# Patient Record
Sex: Male | Born: 1950
Health system: Southern US, Community
[De-identification: ages and names within clinical notes are randomized; demographics above are authoritative.]

## PROBLEM LIST (undated history)

## (undated) DIAGNOSIS — M545 Low back pain, unspecified: Secondary | ICD-10-CM

## (undated) DIAGNOSIS — K219 Gastro-esophageal reflux disease without esophagitis: Secondary | ICD-10-CM

## (undated) DIAGNOSIS — I10 Essential (primary) hypertension: Secondary | ICD-10-CM

## (undated) DIAGNOSIS — G8929 Other chronic pain: Secondary | ICD-10-CM

## (undated) DIAGNOSIS — F419 Anxiety disorder, unspecified: Secondary | ICD-10-CM

## (undated) DIAGNOSIS — G4730 Sleep apnea, unspecified: Secondary | ICD-10-CM

## (undated) DIAGNOSIS — E559 Vitamin D deficiency, unspecified: Secondary | ICD-10-CM

## (undated) DIAGNOSIS — C801 Malignant (primary) neoplasm, unspecified: Secondary | ICD-10-CM

## (undated) DIAGNOSIS — M48061 Spinal stenosis, lumbar region without neurogenic claudication: Secondary | ICD-10-CM

## (undated) DIAGNOSIS — M1990 Unspecified osteoarthritis, unspecified site: Secondary | ICD-10-CM

## (undated) DIAGNOSIS — M48062 Spinal stenosis, lumbar region with neurogenic claudication: Secondary | ICD-10-CM

## (undated) DIAGNOSIS — R7303 Prediabetes: Secondary | ICD-10-CM

## (undated) DIAGNOSIS — R221 Localized swelling, mass and lump, neck: Secondary | ICD-10-CM

## (undated) DIAGNOSIS — F32A Depression, unspecified: Secondary | ICD-10-CM

## (undated) DIAGNOSIS — E785 Hyperlipidemia, unspecified: Secondary | ICD-10-CM

## (undated) DIAGNOSIS — I5189 Other ill-defined heart diseases: Secondary | ICD-10-CM

## (undated) HISTORY — DX: Essential (primary) hypertension: I10

## (undated) HISTORY — PX: COLON SURGERY: SHX602

## (undated) HISTORY — DX: Unspecified osteoarthritis, unspecified site: M19.90

---

## 2004-03-25 HISTORY — PX: COLONOSCOPY: SHX174

## 2005-01-23 DIAGNOSIS — C801 Malignant (primary) neoplasm, unspecified: Secondary | ICD-10-CM

## 2005-01-23 DIAGNOSIS — C189 Malignant neoplasm of colon, unspecified: Secondary | ICD-10-CM

## 2005-01-23 HISTORY — PX: COLECTOMY: SHX59

## 2005-01-23 HISTORY — DX: Malignant neoplasm of colon, unspecified: C18.9

## 2005-01-23 HISTORY — DX: Malignant (primary) neoplasm, unspecified: C80.1

## 2005-03-25 DIAGNOSIS — Z9221 Personal history of antineoplastic chemotherapy: Secondary | ICD-10-CM

## 2005-03-25 DIAGNOSIS — C801 Malignant (primary) neoplasm, unspecified: Secondary | ICD-10-CM

## 2005-03-25 HISTORY — DX: Personal history of antineoplastic chemotherapy: Z92.21

## 2005-03-25 HISTORY — DX: Malignant (primary) neoplasm, unspecified: C80.1

## 2005-03-25 HISTORY — PX: PORTACATH PLACEMENT: SHX2246

## 2005-08-23 HISTORY — PX: PORT-A-CATH REMOVAL: SHX5289

## 2016-07-16 DIAGNOSIS — R7303 Prediabetes: Secondary | ICD-10-CM | POA: Diagnosis not present

## 2016-07-16 DIAGNOSIS — Z0000 Encounter for general adult medical examination without abnormal findings: Secondary | ICD-10-CM | POA: Diagnosis not present

## 2016-07-16 DIAGNOSIS — E669 Obesity, unspecified: Secondary | ICD-10-CM | POA: Diagnosis not present

## 2016-07-16 DIAGNOSIS — G479 Sleep disorder, unspecified: Secondary | ICD-10-CM | POA: Diagnosis not present

## 2016-07-16 DIAGNOSIS — E559 Vitamin D deficiency, unspecified: Secondary | ICD-10-CM | POA: Diagnosis not present

## 2016-07-16 DIAGNOSIS — Z85038 Personal history of other malignant neoplasm of large intestine: Secondary | ICD-10-CM | POA: Diagnosis not present

## 2016-07-16 DIAGNOSIS — Z125 Encounter for screening for malignant neoplasm of prostate: Secondary | ICD-10-CM | POA: Diagnosis not present

## 2016-07-16 DIAGNOSIS — N528 Other male erectile dysfunction: Secondary | ICD-10-CM | POA: Diagnosis not present

## 2016-07-17 DIAGNOSIS — E1169 Type 2 diabetes mellitus with other specified complication: Secondary | ICD-10-CM | POA: Insufficient documentation

## 2016-08-22 DIAGNOSIS — H18413 Arcus senilis, bilateral: Secondary | ICD-10-CM | POA: Diagnosis not present

## 2016-08-22 DIAGNOSIS — H524 Presbyopia: Secondary | ICD-10-CM | POA: Diagnosis not present

## 2016-08-22 DIAGNOSIS — H57059 Tonic pupil, unspecified eye: Secondary | ICD-10-CM | POA: Diagnosis not present

## 2016-08-22 DIAGNOSIS — H2510 Age-related nuclear cataract, unspecified eye: Secondary | ICD-10-CM | POA: Diagnosis not present

## 2016-09-05 DIAGNOSIS — Z85038 Personal history of other malignant neoplasm of large intestine: Secondary | ICD-10-CM | POA: Diagnosis not present

## 2016-09-05 DIAGNOSIS — R221 Localized swelling, mass and lump, neck: Secondary | ICD-10-CM | POA: Diagnosis not present

## 2016-09-05 DIAGNOSIS — K5909 Other constipation: Secondary | ICD-10-CM | POA: Diagnosis not present

## 2016-09-06 DIAGNOSIS — R221 Localized swelling, mass and lump, neck: Secondary | ICD-10-CM | POA: Diagnosis not present

## 2016-09-11 DIAGNOSIS — E669 Obesity, unspecified: Secondary | ICD-10-CM | POA: Diagnosis not present

## 2016-09-11 DIAGNOSIS — Z6831 Body mass index (BMI) 31.0-31.9, adult: Secondary | ICD-10-CM | POA: Diagnosis not present

## 2016-09-11 DIAGNOSIS — E559 Vitamin D deficiency, unspecified: Secondary | ICD-10-CM | POA: Diagnosis not present

## 2016-09-18 DIAGNOSIS — R221 Localized swelling, mass and lump, neck: Secondary | ICD-10-CM | POA: Diagnosis not present

## 2016-09-23 DIAGNOSIS — G479 Sleep disorder, unspecified: Secondary | ICD-10-CM | POA: Diagnosis not present

## 2016-09-23 DIAGNOSIS — G4752 REM sleep behavior disorder: Secondary | ICD-10-CM | POA: Diagnosis not present

## 2016-09-23 DIAGNOSIS — R4689 Other symptoms and signs involving appearance and behavior: Secondary | ICD-10-CM | POA: Diagnosis not present

## 2016-09-23 DIAGNOSIS — G4733 Obstructive sleep apnea (adult) (pediatric): Secondary | ICD-10-CM | POA: Diagnosis not present

## 2016-10-01 DIAGNOSIS — K635 Polyp of colon: Secondary | ICD-10-CM | POA: Diagnosis not present

## 2016-10-01 DIAGNOSIS — Z980 Intestinal bypass and anastomosis status: Secondary | ICD-10-CM | POA: Diagnosis not present

## 2016-10-01 DIAGNOSIS — D123 Benign neoplasm of transverse colon: Secondary | ICD-10-CM | POA: Diagnosis not present

## 2016-10-01 DIAGNOSIS — Z85038 Personal history of other malignant neoplasm of large intestine: Secondary | ICD-10-CM | POA: Diagnosis not present

## 2016-10-01 DIAGNOSIS — D126 Benign neoplasm of colon, unspecified: Secondary | ICD-10-CM | POA: Diagnosis not present

## 2016-10-01 DIAGNOSIS — K640 First degree hemorrhoids: Secondary | ICD-10-CM | POA: Diagnosis not present

## 2016-10-01 DIAGNOSIS — K648 Other hemorrhoids: Secondary | ICD-10-CM | POA: Diagnosis not present

## 2016-10-01 HISTORY — PX: COLONOSCOPY: SHX174

## 2016-10-07 DIAGNOSIS — G4733 Obstructive sleep apnea (adult) (pediatric): Secondary | ICD-10-CM | POA: Diagnosis not present

## 2016-11-13 DIAGNOSIS — G4733 Obstructive sleep apnea (adult) (pediatric): Secondary | ICD-10-CM | POA: Diagnosis not present

## 2017-02-07 DIAGNOSIS — Z0000 Encounter for general adult medical examination without abnormal findings: Secondary | ICD-10-CM | POA: Diagnosis not present

## 2017-02-27 DIAGNOSIS — N528 Other male erectile dysfunction: Secondary | ICD-10-CM | POA: Diagnosis not present

## 2017-02-27 DIAGNOSIS — R7303 Prediabetes: Secondary | ICD-10-CM | POA: Diagnosis not present

## 2017-07-10 DIAGNOSIS — Z0000 Encounter for general adult medical examination without abnormal findings: Secondary | ICD-10-CM | POA: Diagnosis not present

## 2017-07-10 DIAGNOSIS — R7303 Prediabetes: Secondary | ICD-10-CM | POA: Diagnosis not present

## 2017-07-10 DIAGNOSIS — E669 Obesity, unspecified: Secondary | ICD-10-CM | POA: Diagnosis not present

## 2017-07-10 DIAGNOSIS — Z136 Encounter for screening for cardiovascular disorders: Secondary | ICD-10-CM | POA: Diagnosis not present

## 2017-07-23 ENCOUNTER — Encounter (HOSPITAL_COMMUNITY): Payer: Self-pay

## 2017-07-23 ENCOUNTER — Emergency Department (HOSPITAL_COMMUNITY): Payer: Medicare HMO

## 2017-07-23 ENCOUNTER — Emergency Department (HOSPITAL_COMMUNITY)
Admission: EM | Admit: 2017-07-23 | Discharge: 2017-07-23 | Disposition: A | Discharge status: Home / Self Care | Payer: Medicare HMO | Attending: Emergency Medicine | Admitting: Emergency Medicine

## 2017-07-23 ENCOUNTER — Other Ambulatory Visit: Payer: Self-pay

## 2017-07-23 DIAGNOSIS — K5900 Constipation, unspecified: Secondary | ICD-10-CM | POA: Diagnosis not present

## 2017-07-23 DIAGNOSIS — M545 Low back pain: Secondary | ICD-10-CM | POA: Diagnosis not present

## 2017-07-23 DIAGNOSIS — Z85038 Personal history of other malignant neoplasm of large intestine: Secondary | ICD-10-CM | POA: Insufficient documentation

## 2017-07-23 DIAGNOSIS — M5442 Lumbago with sciatica, left side: Secondary | ICD-10-CM | POA: Diagnosis not present

## 2017-07-23 DIAGNOSIS — M5441 Lumbago with sciatica, right side: Secondary | ICD-10-CM | POA: Diagnosis not present

## 2017-07-23 HISTORY — DX: Malignant (primary) neoplasm, unspecified: C80.1

## 2017-07-23 MED ORDER — PREDNISONE 10 MG (21) PO TBPK: ORAL_TABLET | Freq: Every day | ORAL | 0 refills | Status: DC

## 2017-07-23 MED ORDER — ACETAMINOPHEN 325 MG PO TABS: 650.0000 mg | ORAL_TABLET | Freq: Four times a day (QID) | ORAL | 0 refills | Status: DC | PRN

## 2017-07-23 MED ORDER — IBUPROFEN 200 MG PO TABS: 200.0000 mg | ORAL_TABLET | Freq: Four times a day (QID) | ORAL | 0 refills | Status: DC | PRN

## 2017-07-23 NOTE — ED Triage Notes (Signed)
Pt arrived from home with c/o lower back pain down into legs X5-6 months. Pt denies being evaluated for this states he has been using ibuprofen and tylenol for pain. Denies loss of bowel or bladder.

## 2017-07-23 NOTE — Discharge Instructions (Addendum)
You may alternate taking Tylenol and Ibuprofen as needed for pain control. You may take 400-600 mg of ibuprofen every 6 hours and [phone] mg of Tylenol every 6 hours. Do not exceed 4000 mg of Tylenol daily as this can lead to liver damage. Also, make sure to take Ibuprofen with meals as it can cause an upset stomach. Do not take other NSAIDs while taking Ibuprofen such as (Aleve, Naprosyn, Aspirin, Celebrex, etc) and do not take more than the prescribed dose as this can lead to ulcers and bleeding in your GI tract. You may use warm and cold compresses to help with your symptoms.   You were also given a prescription for a steroid taper to help improve your symptoms. I have prescribed a new medication for you today. It is important that when you pick the prescription up you discuss the potential interactions of this medication with other medications you are taking, including over the counter medications, with the pharmacists.   This new medication has potential side effects. Be sure to contact your primary care provider or return to the emergency department if you are experiencing new symptoms that you are unable to tolerate after starting the medication. You need to receive medical evaluation immediately if you start to experience blistering of the skin, rash, swelling, or difficulty breathing as these signs could indicate a more serious medication side effect.   Please follow up with your primary doctor within the next 7-10 days for re-evaluation and further treatment of your symptoms.  You were also given a referral to a spine doctor, Dr.Yarbrough, and you may call the office to make an appointment for follow-up  Return to the emergency department immediately if you experience any back pain associated with fevers, loss of control of your bowels/bladder, weakness/numbness to your legs, numbness to your groin area, inability to walk, or inability to urinate.

## 2017-07-23 NOTE — ED Provider Notes (Signed)
[place] EMERGENCY DEPARTMENT Provider Note   CSN: [id number] Arrival date & time: 07/23/17  0957     History   Chief Complaint Chief Complaint  Patient presents with  . Back Pain    HPI Travis Santiago is a 67 y.o. male.  HPI   Pt is a 67 year old male with a history of colon cancer s/p resection presents the ED complaining of lower back pain that has been ongoing for the last 6 months.  States pain is constant and severe in nature.  It is worse when he walks for long periods of time.  He has tried taking ibuprofen with no relief.  States pain radiates to bilateral lower extremities.  Reports intermittent paresthesias to the bilateral lower extremities but denies any numbness or weakness to the legs. Denies saddle anesthesia. Denies loss of control of bowels or bladder. No urinary retention. No fevers or night sweats. Denies a h/o IVDU. Denies recent unintended weight loss.  Denies abdominal pain, nausea, vomiting, diarrhea.  No urinary symptoms.  Has history of chronic constipation.  No blood in stool.  No chest pain or shortness of breath.    Past Medical History:  Diagnosis Date  . Cancer [facility]) 2007   colon     There are no active problems to display for this patient.   Past Surgical History:  Procedure Laterality Date  . COLON SURGERY          Home Medications    Prior to Admission medications   Not on File    Family History No family history on file.  Social History Social History   Tobacco Use  . Smoking status: Never Smoker  . Smokeless tobacco: Never Used  Substance Use Topics  . Alcohol use: Yes    Alcohol/week: 0.6 oz    Types: 1 Glasses of wine per week    Comment: social   . Drug use: Not Currently     Allergies   Patient has no known allergies.   Review of Systems Review of Systems  Constitutional: Negative for chills and fever.  HENT: Negative for ear pain and sore throat.   Eyes: Negative for pain and  visual disturbance.  Respiratory: Negative for shortness of breath.   Cardiovascular: Negative for chest pain and palpitations.  Gastrointestinal: Positive for constipation. Negative for abdominal pain, diarrhea, nausea and vomiting.  Genitourinary: Negative for dysuria, flank pain, frequency, hematuria and urgency.  Musculoskeletal: Positive for back pain.  Skin: Negative for rash.  Neurological: Negative for weakness and numbness.       Paresthesias  All other systems reviewed and are negative.    Physical Exam Updated Vital Signs BP (!) 124/96 (BP Location: Right Arm)   Pulse 85   Temp 98.3 F (36.8 C) (Oral)   Resp 16   Ht 5\' 7"  (1.702 m)   Wt 90.7 kg (200 lb)   SpO2 95%   BMI 31.32 kg/m   Physical Exam  Constitutional: He appears well-developed and well-nourished.  HENT:  Head: Normocephalic and atraumatic.  Eyes: Conjunctivae are normal.  Neck: Neck supple.  Cardiovascular: Normal rate, regular rhythm, normal heart sounds and intact distal pulses.  No murmur heard. Pulmonary/Chest: Effort normal and breath sounds normal. No respiratory distress. He has no wheezes.  Abdominal: Soft. Bowel sounds are normal. There is no tenderness.  Musculoskeletal: He exhibits no edema.  No cervical or thoracic midline ttp. Midline lumbar ttp with associated bilateral paraspinous tenderness and tenderness across bilateral  sciatic notches which reproduces his pain.  Neurological: He is alert.  Normal tone. 5/5 strength of BUE and BLE major muscle groups including strong and equal grip strength and dorsiflexion/plantar flexion Sensory: light touch normal in all extremities. DTRs: patellar 2+ symmetric b/l Gait: normal gait and balance. Able to walk on toes CV: 2+ radial and DP/PT pulses  Skin: Skin is warm and dry.  Psychiatric: He has a normal mood and affect.  Nursing note and vitals reviewed.   ED Treatments / Results  Labs (all labs ordered are listed, but only abnormal  results are displayed) Labs Reviewed - No data to display  EKG None  Radiology Dg Lumbar Spine Complete  Result Date: 07/23/2017 CLINICAL DATA:  Chronic low back pain for 6 months. EXAM: LUMBAR SPINE - COMPLETE 4+ VIEW COMPARISON:  None. FINDINGS: Normal alignment. Disc spaces maintained. No fracture. Mild degenerative facet disease from L3-4 through L5-S1. SI joints are symmetric and unremarkable. IMPRESSION: Degenerative facet disease in the mid and lower lumbar spine. No acute findings. Electronically Signed   By: Rolm Baptise M.D.   On: 07/23/2017 11:23    Procedures Procedures (including critical care time)  Medications Ordered in ED Medications - No data to display   Initial Impression / Assessment and Plan / ED Course  I have reviewed the triage vital signs and the nursing notes.  Pertinent labs & imaging results that were available during my care of the patient were reviewed by me and considered in my medical decision making (see chart for details).      Final Clinical Impressions(s) / ED Diagnoses   Final diagnoses:  Acute midline low back pain with bilateral sciatica   Patient with back pain.  No neurological deficits and normal neuro exam.  Patient can walk but states is painful.  No loss of bowel or bladder control.  No concern for cauda equina.  No fever, night sweats, weight loss, or h/o IVDU.  Patient does have distant history of colon cancer in remission therefore lumbar x-ray was ordered.  X-ray showed degenerative changes but no acute findings.  RICE protocol and pain medicine indicated and discussed with patient.  Will also give him steroid taper and have him follow-up with PCP in 1 week for reevaluation.  Gave strict return precautions for any new or worsening symptoms.  Patient and his wife voiced understanding of plan restarting immediately to the ED.  All questions were answered.  ED Discharge Orders    None       Rodney Booze, [place] 07/23/17 1226     Little, Wenda Overland, MD 07/23/17 1550

## 2017-07-25 ENCOUNTER — Other Ambulatory Visit: Payer: Self-pay | Admitting: Family Medicine

## 2017-07-25 DIAGNOSIS — M4306 Spondylolysis, lumbar region: Secondary | ICD-10-CM | POA: Diagnosis not present

## 2017-07-25 DIAGNOSIS — M5416 Radiculopathy, lumbar region: Secondary | ICD-10-CM

## 2017-08-01 ENCOUNTER — Ambulatory Visit
Admission: RE | Admit: 2017-08-01 | Discharge: 2017-08-01 | Disposition: A | Discharge status: Home / Self Care | Payer: Medicare HMO | Source: Ambulatory Visit | Attending: Family Medicine | Admitting: Family Medicine

## 2017-08-01 DIAGNOSIS — M5416 Radiculopathy, lumbar region: Secondary | ICD-10-CM

## 2017-08-01 DIAGNOSIS — M8938 Hypertrophy of bone, other site: Secondary | ICD-10-CM | POA: Insufficient documentation

## 2017-08-01 DIAGNOSIS — M47896 Other spondylosis, lumbar region: Secondary | ICD-10-CM | POA: Insufficient documentation

## 2017-08-01 DIAGNOSIS — M545 Low back pain: Secondary | ICD-10-CM | POA: Diagnosis not present

## 2017-08-01 DIAGNOSIS — M48061 Spinal stenosis, lumbar region without neurogenic claudication: Secondary | ICD-10-CM | POA: Diagnosis not present

## 2017-08-01 DIAGNOSIS — M5116 Intervertebral disc disorders with radiculopathy, lumbar region: Secondary | ICD-10-CM | POA: Insufficient documentation

## 2017-08-15 DIAGNOSIS — M5416 Radiculopathy, lumbar region: Secondary | ICD-10-CM | POA: Diagnosis not present

## 2017-08-15 DIAGNOSIS — M5136 Other intervertebral disc degeneration, lumbar region: Secondary | ICD-10-CM | POA: Diagnosis not present

## 2017-08-15 DIAGNOSIS — M48062 Spinal stenosis, lumbar region with neurogenic claudication: Secondary | ICD-10-CM | POA: Diagnosis not present

## 2017-08-25 DIAGNOSIS — H524 Presbyopia: Secondary | ICD-10-CM | POA: Diagnosis not present

## 2017-08-26 DIAGNOSIS — Z0100 Encounter for examination of eyes and vision without abnormal findings: Secondary | ICD-10-CM | POA: Diagnosis not present

## 2017-09-05 DIAGNOSIS — M48062 Spinal stenosis, lumbar region with neurogenic claudication: Secondary | ICD-10-CM | POA: Diagnosis not present

## 2017-09-05 DIAGNOSIS — M5416 Radiculopathy, lumbar region: Secondary | ICD-10-CM | POA: Diagnosis not present

## 2017-09-05 DIAGNOSIS — M5136 Other intervertebral disc degeneration, lumbar region: Secondary | ICD-10-CM | POA: Diagnosis not present

## 2017-09-26 DIAGNOSIS — M48062 Spinal stenosis, lumbar region with neurogenic claudication: Secondary | ICD-10-CM | POA: Diagnosis not present

## 2017-09-26 DIAGNOSIS — M4306 Spondylolysis, lumbar region: Secondary | ICD-10-CM | POA: Diagnosis not present

## 2017-09-26 DIAGNOSIS — M5416 Radiculopathy, lumbar region: Secondary | ICD-10-CM | POA: Diagnosis not present

## 2017-10-07 DIAGNOSIS — M47816 Spondylosis without myelopathy or radiculopathy, lumbar region: Secondary | ICD-10-CM | POA: Diagnosis not present

## 2017-10-07 DIAGNOSIS — M48061 Spinal stenosis, lumbar region without neurogenic claudication: Secondary | ICD-10-CM | POA: Diagnosis not present

## 2017-10-20 DIAGNOSIS — R29898 Other symptoms and signs involving the musculoskeletal system: Secondary | ICD-10-CM | POA: Diagnosis not present

## 2017-10-20 DIAGNOSIS — G8929 Other chronic pain: Secondary | ICD-10-CM | POA: Diagnosis not present

## 2017-10-20 DIAGNOSIS — R200 Anesthesia of skin: Secondary | ICD-10-CM | POA: Diagnosis not present

## 2017-10-20 DIAGNOSIS — M545 Low back pain: Secondary | ICD-10-CM | POA: Diagnosis not present

## 2017-11-11 DIAGNOSIS — M5416 Radiculopathy, lumbar region: Secondary | ICD-10-CM | POA: Diagnosis not present

## 2017-11-12 ENCOUNTER — Other Ambulatory Visit: Payer: Self-pay | Admitting: Student

## 2017-11-12 DIAGNOSIS — G5791 Unspecified mononeuropathy of right lower limb: Secondary | ICD-10-CM

## 2017-11-18 DIAGNOSIS — M5416 Radiculopathy, lumbar region: Secondary | ICD-10-CM | POA: Diagnosis not present

## 2017-11-20 DIAGNOSIS — M5416 Radiculopathy, lumbar region: Secondary | ICD-10-CM | POA: Diagnosis not present

## 2017-11-23 ENCOUNTER — Ambulatory Visit
Admission: RE | Admit: 2017-11-23 | Discharge: 2017-11-23 | Disposition: A | Discharge status: Home / Self Care | Payer: Medicare HMO | Source: Ambulatory Visit | Attending: Student | Admitting: Student

## 2017-11-23 DIAGNOSIS — G5791 Unspecified mononeuropathy of right lower limb: Secondary | ICD-10-CM | POA: Insufficient documentation

## 2017-11-23 DIAGNOSIS — M79661 Pain in right lower leg: Secondary | ICD-10-CM | POA: Diagnosis not present

## 2017-11-26 ENCOUNTER — Other Ambulatory Visit: Payer: Self-pay | Admitting: Neurological Surgery

## 2017-11-26 DIAGNOSIS — M5416 Radiculopathy, lumbar region: Secondary | ICD-10-CM | POA: Diagnosis not present

## 2017-11-26 DIAGNOSIS — G5791 Unspecified mononeuropathy of right lower limb: Secondary | ICD-10-CM

## 2017-12-18 ENCOUNTER — Ambulatory Visit
Admission: RE | Admit: 2017-12-18 | Discharge: 2017-12-18 | Disposition: A | Discharge status: Home / Self Care | Payer: Medicare HMO | Source: Ambulatory Visit | Attending: Neurological Surgery | Admitting: Neurological Surgery

## 2017-12-18 DIAGNOSIS — M23321 Other meniscus derangements, posterior horn of medial meniscus, right knee: Secondary | ICD-10-CM | POA: Diagnosis not present

## 2017-12-18 DIAGNOSIS — S83241A Other tear of medial meniscus, current injury, right knee, initial encounter: Secondary | ICD-10-CM | POA: Insufficient documentation

## 2017-12-18 DIAGNOSIS — G5791 Unspecified mononeuropathy of right lower limb: Secondary | ICD-10-CM | POA: Insufficient documentation

## 2017-12-18 DIAGNOSIS — X58XXXA Exposure to other specified factors, initial encounter: Secondary | ICD-10-CM | POA: Diagnosis not present

## 2017-12-23 DIAGNOSIS — M48062 Spinal stenosis, lumbar region with neurogenic claudication: Secondary | ICD-10-CM | POA: Diagnosis not present

## 2017-12-23 DIAGNOSIS — M47816 Spondylosis without myelopathy or radiculopathy, lumbar region: Secondary | ICD-10-CM | POA: Diagnosis not present

## 2017-12-23 DIAGNOSIS — G5791 Unspecified mononeuropathy of right lower limb: Secondary | ICD-10-CM | POA: Diagnosis not present

## 2018-02-10 DIAGNOSIS — M48062 Spinal stenosis, lumbar region with neurogenic claudication: Secondary | ICD-10-CM | POA: Diagnosis not present

## 2018-02-16 DIAGNOSIS — R7303 Prediabetes: Secondary | ICD-10-CM | POA: Diagnosis not present

## 2018-02-16 DIAGNOSIS — I10 Essential (primary) hypertension: Secondary | ICD-10-CM | POA: Diagnosis not present

## 2018-02-17 DIAGNOSIS — M5136 Other intervertebral disc degeneration, lumbar region: Secondary | ICD-10-CM | POA: Diagnosis not present

## 2018-02-17 DIAGNOSIS — M48062 Spinal stenosis, lumbar region with neurogenic claudication: Secondary | ICD-10-CM | POA: Diagnosis not present

## 2018-02-17 DIAGNOSIS — M4726 Other spondylosis with radiculopathy, lumbar region: Secondary | ICD-10-CM | POA: Diagnosis not present

## 2018-02-17 DIAGNOSIS — M5416 Radiculopathy, lumbar region: Secondary | ICD-10-CM | POA: Diagnosis not present

## 2018-03-31 DIAGNOSIS — M47816 Spondylosis without myelopathy or radiculopathy, lumbar region: Secondary | ICD-10-CM | POA: Diagnosis not present

## 2018-04-21 DIAGNOSIS — M47816 Spondylosis without myelopathy or radiculopathy, lumbar region: Secondary | ICD-10-CM | POA: Diagnosis not present

## 2018-04-21 DIAGNOSIS — M48062 Spinal stenosis, lumbar region with neurogenic claudication: Secondary | ICD-10-CM | POA: Diagnosis not present

## 2018-04-21 DIAGNOSIS — M5136 Other intervertebral disc degeneration, lumbar region: Secondary | ICD-10-CM | POA: Diagnosis not present

## 2018-04-21 DIAGNOSIS — M5416 Radiculopathy, lumbar region: Secondary | ICD-10-CM | POA: Diagnosis not present

## 2018-06-15 DIAGNOSIS — E669 Obesity, unspecified: Secondary | ICD-10-CM | POA: Diagnosis not present

## 2018-06-15 DIAGNOSIS — M545 Low back pain: Secondary | ICD-10-CM | POA: Diagnosis not present

## 2018-06-15 DIAGNOSIS — I10 Essential (primary) hypertension: Secondary | ICD-10-CM | POA: Diagnosis not present

## 2018-06-15 DIAGNOSIS — G8929 Other chronic pain: Secondary | ICD-10-CM | POA: Diagnosis not present

## 2018-06-15 DIAGNOSIS — R7303 Prediabetes: Secondary | ICD-10-CM | POA: Diagnosis not present

## 2018-06-15 DIAGNOSIS — Z136 Encounter for screening for cardiovascular disorders: Secondary | ICD-10-CM | POA: Diagnosis not present

## 2018-06-28 IMAGING — MR MR LUMBAR SPINE W/O CM
4 of 5 series · 26 of 48 positions shown · non-contrast
Comparison: Radiography 07/23/2017

CLINICAL DATA: Low back pain with bilateral leg and buttock pain.
Lumbar radiculopathy.

EXAM:
MRI LUMBAR SPINE WITHOUT CONTRAST
TECHNIQUE: Multiplanar, multisequence MR imaging of the lumbar spine was
performed. No intravenous contrast was administered.

[Series 3: T2 · sagittal · 4.0mm · 0.81mm/px · 6 of 15 slices shown (1 of 2)]
[im 1/15]
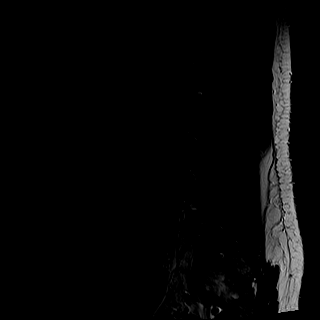
[im 3/15]
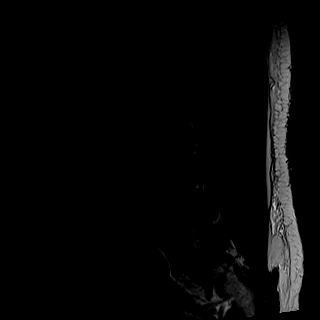
[im 6/15]
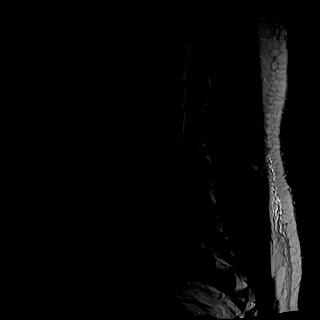
[im 9/15]
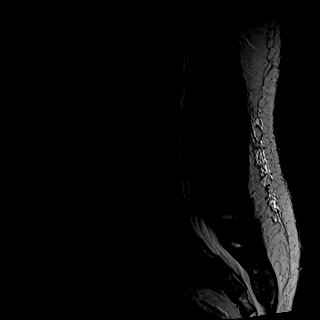
[im 12/15]
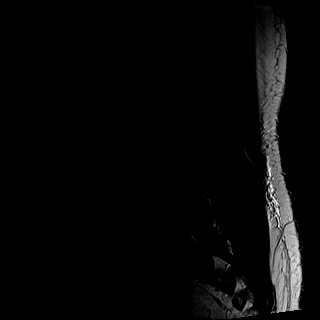
[im 15/15]
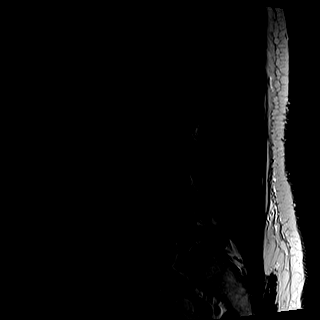

[Series 4: T1 · sagittal · 4.0mm · 0.41mm/px · 6 of 15 slices shown (1 of 2)]
[im 1/15]
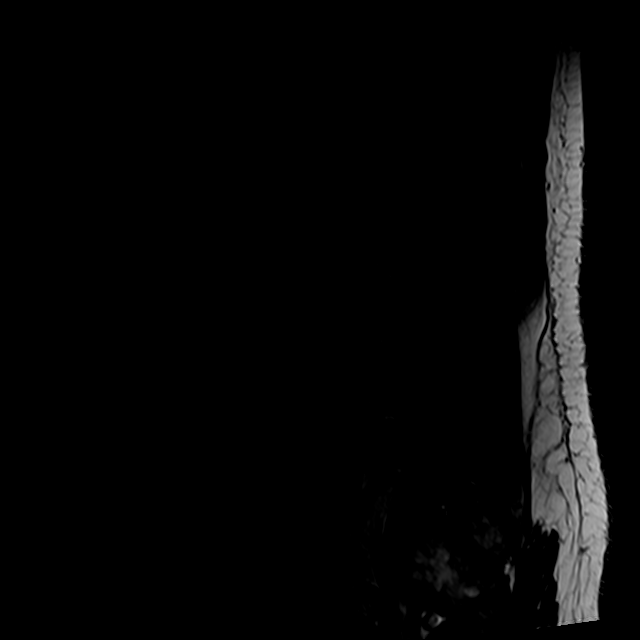
[im 3/15]
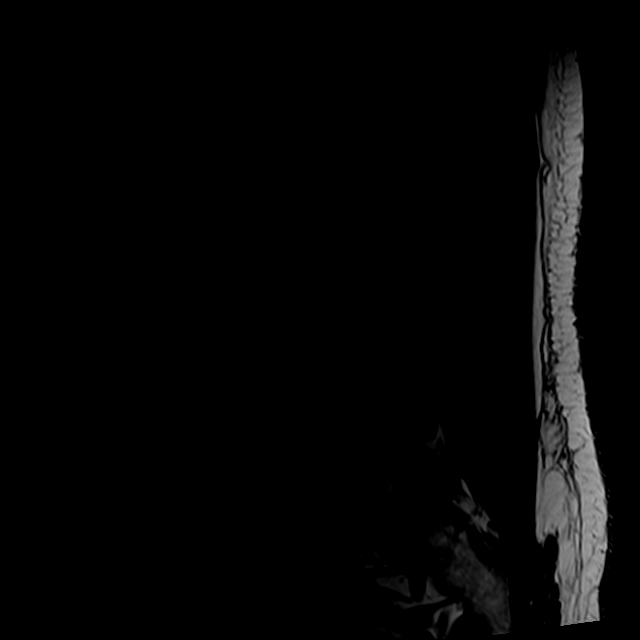
[im 6/15]
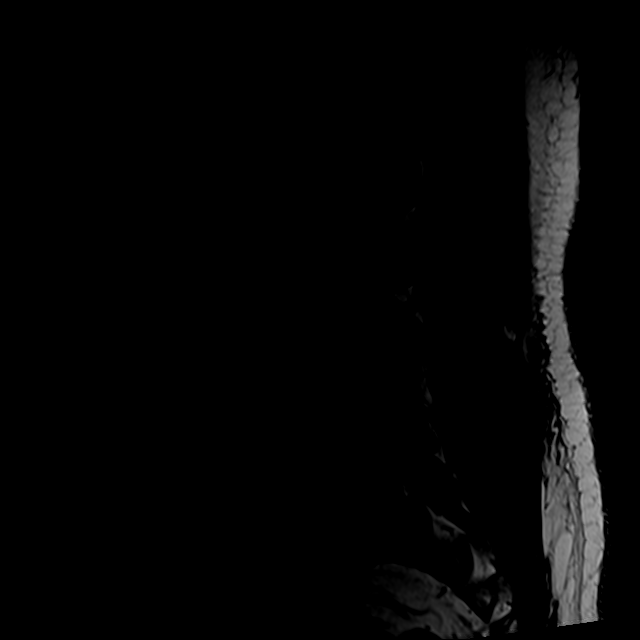
[im 9/15]
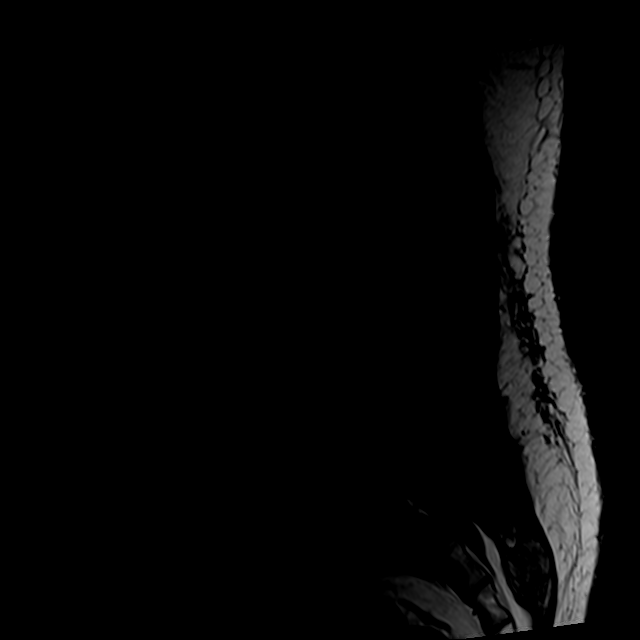
[im 12/15]
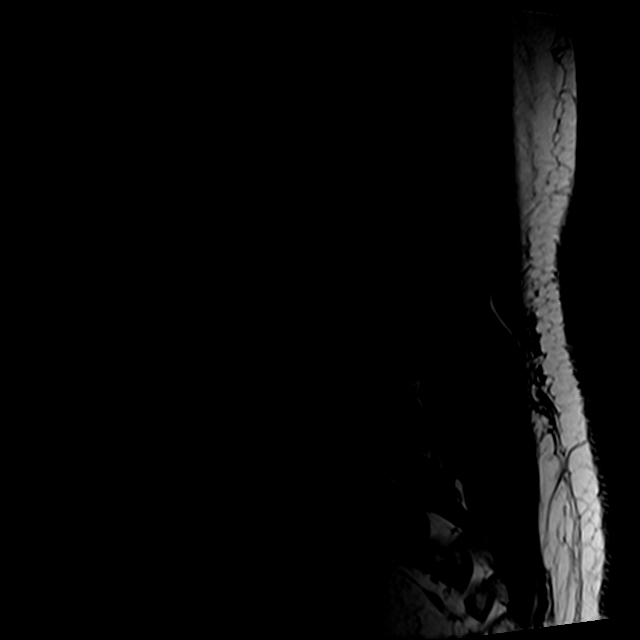
[im 15/15]
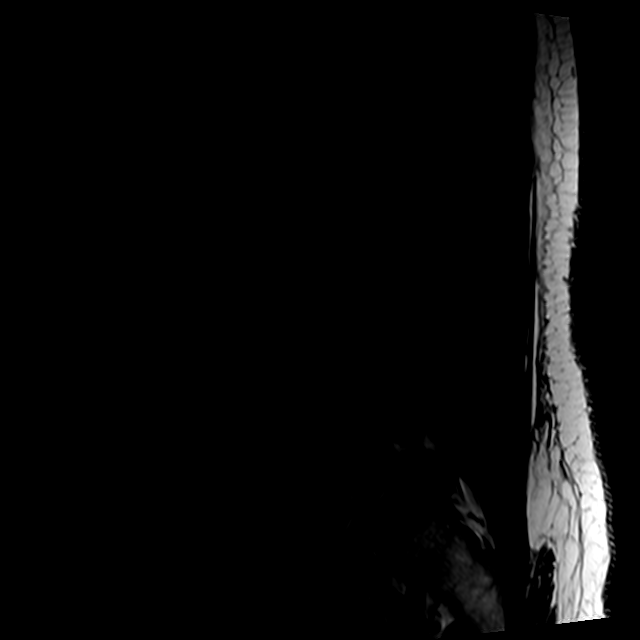

[Series 6: T1 · oblique · 4.0mm · 0.39mm/px · 5 of 37 slices shown (2 of 2)]
[im 1/37]
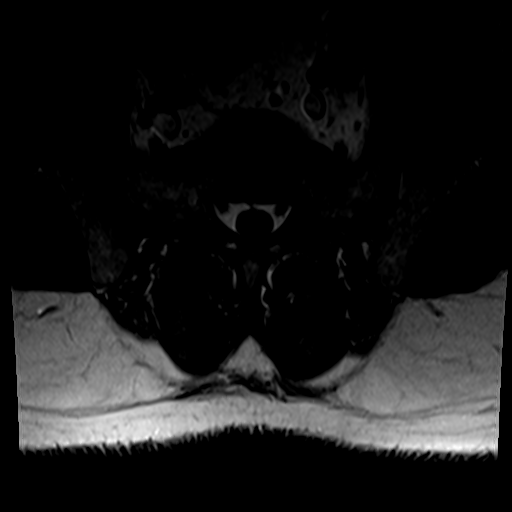
[im 6/37]
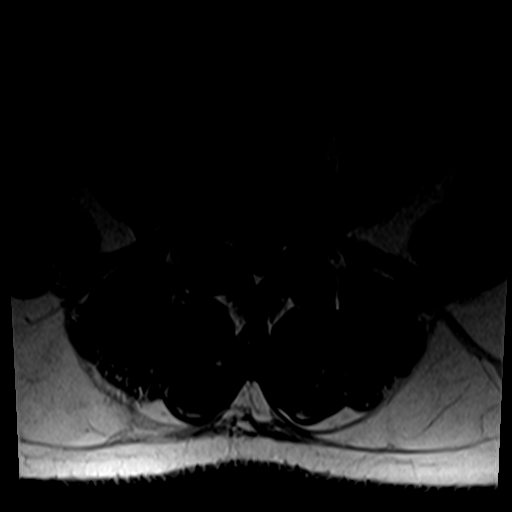
[im 11/37]
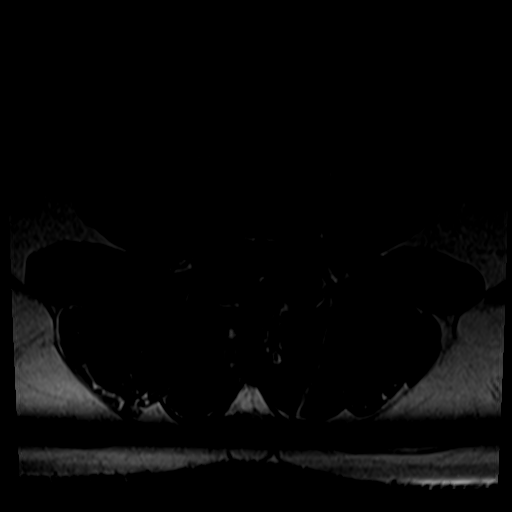
[im 19/37]
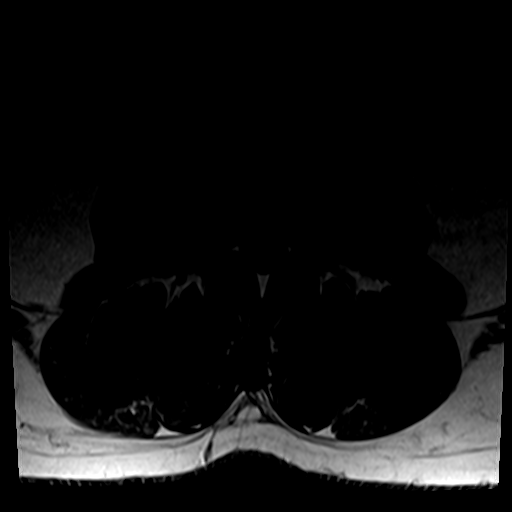
[im 31/37]
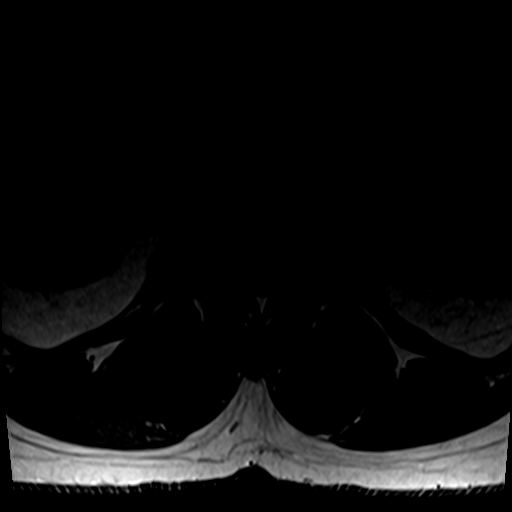

[Series 7: T2 · oblique · 4.0mm · 0.78mm/px · 9 of 37 slices shown (2 of 2)]
[im 1/37]
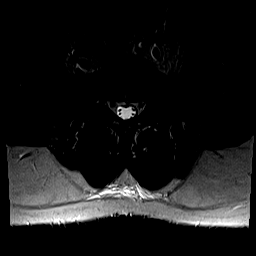
[im 6/37]
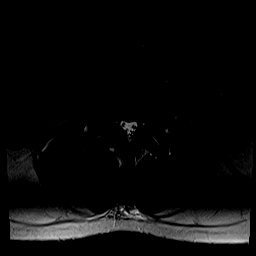
[im 11/37]
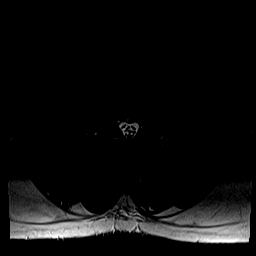
[im 16/37]
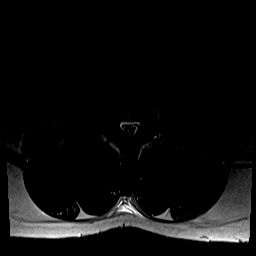
[im 19/37]
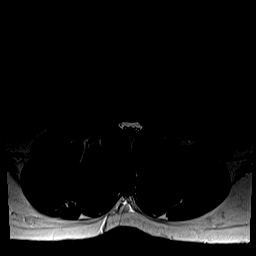
[im 21/37]
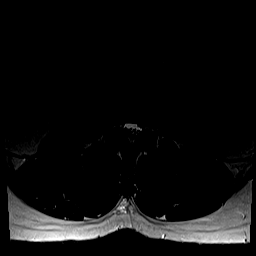
[im 26/37]
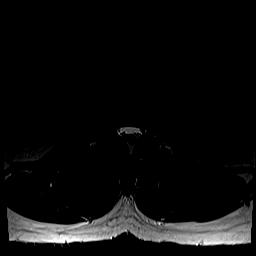
[im 31/37]
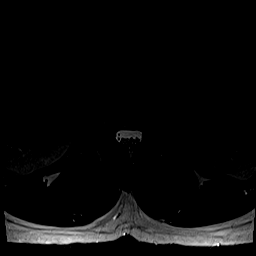
[im 37/37]
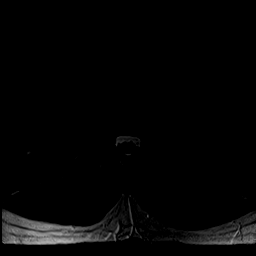

[26 of 48 positions shown; findings below may reference images not displayed]

FINDINGS: Segmentation:  5 lumbar type vertebral bodies.

Alignment:  Normal

Vertebrae:  No fracture or primary bone lesion.

Conus medullaris and cauda equina: Conus extends to the L1 level.
Conus and cauda equina appear normal.

Paraspinal and other soft tissues: Negative

Disc levels:

No abnormality at T12-L1 or L1-2.

L2-3: No disc abnormality. Mild facet hypertrophy. No compressive
stenosis.

L3-4: Mild desiccation and circumferential bulging of the disc.
Bilateral facet and ligamentous hypertrophy. Moderate spinal
stenosis, particularly in the lateral recesses. Foraminal narrowing
right more than left. Findings at this level could be symptomatic.

L4-5: Mild bulging of the disc. Facet and ligamentous hypertrophy.
Mild stenosis of both subarticular lateral recesses and foramina.

L5-S1: Mild bulging of the disc. Mild facet degeneration. No canal
or foraminal stenosis.
IMPRESSION: L3-4: Multifactorial spinal stenosis due to circumferential bulging
of the disc in combination with facet and ligamentous hypertrophy.
Stenosis at this level could be symptomatic, particularly in either
lateral recess or neural foramen. Foraminal narrowing slightly worse
on the right.

L4-5: Mild bilateral lateral recess stenosis because of bulging of
the disc and facet degeneration and hypertrophy. Some potential this
could be symptomatic, but this stenosis is not as pronounced as at
the L3-4 level.

L5-S1: Minimal disc bulge. Facet osteoarthritis. No stenosis.
Findings could contribute to low back pain.

L2-3: Mild facet hypertrophy.  No compressive stenosis.

## 2018-07-28 DIAGNOSIS — M47816 Spondylosis without myelopathy or radiculopathy, lumbar region: Secondary | ICD-10-CM | POA: Diagnosis not present

## 2018-07-28 DIAGNOSIS — M48062 Spinal stenosis, lumbar region with neurogenic claudication: Secondary | ICD-10-CM | POA: Diagnosis not present

## 2018-07-28 DIAGNOSIS — M5136 Other intervertebral disc degeneration, lumbar region: Secondary | ICD-10-CM | POA: Diagnosis not present

## 2018-07-28 DIAGNOSIS — M5416 Radiculopathy, lumbar region: Secondary | ICD-10-CM | POA: Diagnosis not present

## 2018-08-28 DIAGNOSIS — M5416 Radiculopathy, lumbar region: Secondary | ICD-10-CM | POA: Diagnosis not present

## 2018-08-28 DIAGNOSIS — M5136 Other intervertebral disc degeneration, lumbar region: Secondary | ICD-10-CM | POA: Diagnosis not present

## 2018-08-28 DIAGNOSIS — M48062 Spinal stenosis, lumbar region with neurogenic claudication: Secondary | ICD-10-CM | POA: Diagnosis not present

## 2018-09-28 DIAGNOSIS — M48062 Spinal stenosis, lumbar region with neurogenic claudication: Secondary | ICD-10-CM | POA: Diagnosis not present

## 2018-09-28 DIAGNOSIS — M5136 Other intervertebral disc degeneration, lumbar region: Secondary | ICD-10-CM | POA: Diagnosis not present

## 2018-09-28 DIAGNOSIS — M5416 Radiculopathy, lumbar region: Secondary | ICD-10-CM | POA: Diagnosis not present

## 2018-10-05 DIAGNOSIS — Z136 Encounter for screening for cardiovascular disorders: Secondary | ICD-10-CM | POA: Diagnosis not present

## 2018-10-05 DIAGNOSIS — R7303 Prediabetes: Secondary | ICD-10-CM | POA: Diagnosis not present

## 2018-10-05 DIAGNOSIS — I10 Essential (primary) hypertension: Secondary | ICD-10-CM | POA: Diagnosis not present

## 2018-10-12 DIAGNOSIS — E669 Obesity, unspecified: Secondary | ICD-10-CM | POA: Diagnosis not present

## 2018-10-12 DIAGNOSIS — R7303 Prediabetes: Secondary | ICD-10-CM | POA: Diagnosis not present

## 2018-10-12 DIAGNOSIS — Z1159 Encounter for screening for other viral diseases: Secondary | ICD-10-CM | POA: Diagnosis not present

## 2018-10-12 DIAGNOSIS — Z125 Encounter for screening for malignant neoplasm of prostate: Secondary | ICD-10-CM | POA: Diagnosis not present

## 2018-10-12 DIAGNOSIS — Z0000 Encounter for general adult medical examination without abnormal findings: Secondary | ICD-10-CM | POA: Diagnosis not present

## 2018-10-12 DIAGNOSIS — I10 Essential (primary) hypertension: Secondary | ICD-10-CM | POA: Diagnosis not present

## 2018-10-19 DIAGNOSIS — M48062 Spinal stenosis, lumbar region with neurogenic claudication: Secondary | ICD-10-CM | POA: Diagnosis not present

## 2018-10-19 DIAGNOSIS — M47816 Spondylosis without myelopathy or radiculopathy, lumbar region: Secondary | ICD-10-CM | POA: Diagnosis not present

## 2018-10-19 DIAGNOSIS — M5136 Other intervertebral disc degeneration, lumbar region: Secondary | ICD-10-CM | POA: Diagnosis not present

## 2018-10-19 DIAGNOSIS — M5416 Radiculopathy, lumbar region: Secondary | ICD-10-CM | POA: Diagnosis not present

## 2018-11-18 DIAGNOSIS — I10 Essential (primary) hypertension: Secondary | ICD-10-CM | POA: Diagnosis not present

## 2018-11-18 DIAGNOSIS — H524 Presbyopia: Secondary | ICD-10-CM | POA: Diagnosis not present

## 2019-02-10 DIAGNOSIS — K5909 Other constipation: Secondary | ICD-10-CM | POA: Diagnosis not present

## 2019-02-10 DIAGNOSIS — Z87891 Personal history of nicotine dependence: Secondary | ICD-10-CM | POA: Diagnosis not present

## 2019-02-10 DIAGNOSIS — K5900 Constipation, unspecified: Secondary | ICD-10-CM | POA: Diagnosis not present

## 2019-02-23 DIAGNOSIS — M48062 Spinal stenosis, lumbar region with neurogenic claudication: Secondary | ICD-10-CM | POA: Diagnosis not present

## 2019-02-23 DIAGNOSIS — M5416 Radiculopathy, lumbar region: Secondary | ICD-10-CM | POA: Diagnosis not present

## 2019-02-23 DIAGNOSIS — M5136 Other intervertebral disc degeneration, lumbar region: Secondary | ICD-10-CM | POA: Diagnosis not present

## 2019-03-30 DIAGNOSIS — M48062 Spinal stenosis, lumbar region with neurogenic claudication: Secondary | ICD-10-CM | POA: Diagnosis not present

## 2019-03-30 DIAGNOSIS — M5416 Radiculopathy, lumbar region: Secondary | ICD-10-CM | POA: Diagnosis not present

## 2019-03-30 DIAGNOSIS — M5136 Other intervertebral disc degeneration, lumbar region: Secondary | ICD-10-CM | POA: Diagnosis not present

## 2019-04-08 DIAGNOSIS — I10 Essential (primary) hypertension: Secondary | ICD-10-CM | POA: Diagnosis not present

## 2019-04-08 DIAGNOSIS — R7303 Prediabetes: Secondary | ICD-10-CM | POA: Diagnosis not present

## 2019-04-08 DIAGNOSIS — Z1159 Encounter for screening for other viral diseases: Secondary | ICD-10-CM | POA: Diagnosis not present

## 2019-04-08 DIAGNOSIS — Z125 Encounter for screening for malignant neoplasm of prostate: Secondary | ICD-10-CM | POA: Diagnosis not present

## 2019-04-15 DIAGNOSIS — Z6834 Body mass index (BMI) 34.0-34.9, adult: Secondary | ICD-10-CM | POA: Diagnosis not present

## 2019-04-15 DIAGNOSIS — E669 Obesity, unspecified: Secondary | ICD-10-CM | POA: Diagnosis not present

## 2019-04-15 DIAGNOSIS — Z136 Encounter for screening for cardiovascular disorders: Secondary | ICD-10-CM | POA: Diagnosis not present

## 2019-04-15 DIAGNOSIS — I10 Essential (primary) hypertension: Secondary | ICD-10-CM | POA: Diagnosis not present

## 2019-04-15 DIAGNOSIS — Z87891 Personal history of nicotine dependence: Secondary | ICD-10-CM | POA: Diagnosis not present

## 2019-04-15 DIAGNOSIS — R7303 Prediabetes: Secondary | ICD-10-CM | POA: Diagnosis not present

## 2019-04-27 DIAGNOSIS — M5136 Other intervertebral disc degeneration, lumbar region: Secondary | ICD-10-CM | POA: Diagnosis not present

## 2019-04-27 DIAGNOSIS — M47816 Spondylosis without myelopathy or radiculopathy, lumbar region: Secondary | ICD-10-CM | POA: Diagnosis not present

## 2019-04-27 DIAGNOSIS — M48062 Spinal stenosis, lumbar region with neurogenic claudication: Secondary | ICD-10-CM | POA: Diagnosis not present

## 2019-04-27 DIAGNOSIS — M5416 Radiculopathy, lumbar region: Secondary | ICD-10-CM | POA: Diagnosis not present

## 2019-07-12 DIAGNOSIS — M545 Low back pain: Secondary | ICD-10-CM | POA: Diagnosis not present

## 2019-07-12 DIAGNOSIS — M48061 Spinal stenosis, lumbar region without neurogenic claudication: Secondary | ICD-10-CM | POA: Diagnosis not present

## 2019-07-23 DIAGNOSIS — M48061 Spinal stenosis, lumbar region without neurogenic claudication: Secondary | ICD-10-CM | POA: Diagnosis not present

## 2019-07-23 DIAGNOSIS — M545 Low back pain: Secondary | ICD-10-CM | POA: Diagnosis not present

## 2019-08-12 DIAGNOSIS — M48061 Spinal stenosis, lumbar region without neurogenic claudication: Secondary | ICD-10-CM | POA: Diagnosis not present

## 2019-08-12 DIAGNOSIS — M545 Low back pain: Secondary | ICD-10-CM | POA: Diagnosis not present

## 2019-08-19 DIAGNOSIS — M545 Low back pain: Secondary | ICD-10-CM | POA: Diagnosis not present

## 2019-11-12 DIAGNOSIS — R7303 Prediabetes: Secondary | ICD-10-CM | POA: Diagnosis not present

## 2019-11-12 DIAGNOSIS — Z136 Encounter for screening for cardiovascular disorders: Secondary | ICD-10-CM | POA: Diagnosis not present

## 2019-11-12 DIAGNOSIS — I10 Essential (primary) hypertension: Secondary | ICD-10-CM | POA: Diagnosis not present

## 2019-11-19 DIAGNOSIS — Z0000 Encounter for general adult medical examination without abnormal findings: Secondary | ICD-10-CM | POA: Diagnosis not present

## 2019-11-19 DIAGNOSIS — K5900 Constipation, unspecified: Secondary | ICD-10-CM | POA: Diagnosis not present

## 2019-11-19 DIAGNOSIS — R7303 Prediabetes: Secondary | ICD-10-CM | POA: Diagnosis not present

## 2020-01-05 DIAGNOSIS — Z135 Encounter for screening for eye and ear disorders: Secondary | ICD-10-CM | POA: Diagnosis not present

## 2020-01-05 DIAGNOSIS — H524 Presbyopia: Secondary | ICD-10-CM | POA: Diagnosis not present

## 2020-02-16 DIAGNOSIS — R634 Abnormal weight loss: Secondary | ICD-10-CM | POA: Diagnosis not present

## 2020-02-16 DIAGNOSIS — N528 Other male erectile dysfunction: Secondary | ICD-10-CM | POA: Diagnosis not present

## 2020-03-13 ENCOUNTER — Encounter: Payer: Self-pay | Admitting: Oncology

## 2020-03-13 ENCOUNTER — Inpatient Hospital Stay: Payer: Medicare HMO | Attending: Oncology | Admitting: Oncology

## 2020-03-13 ENCOUNTER — Inpatient Hospital Stay: Payer: Medicare HMO

## 2020-03-13 ENCOUNTER — Encounter (INDEPENDENT_AMBULATORY_CARE_PROVIDER_SITE_OTHER): Payer: Self-pay

## 2020-03-13 VITALS — BP 147/96 | HR 71 | Resp 16 | Ht 68.7 in | Wt 184.3 lb

## 2020-03-13 DIAGNOSIS — M1990 Unspecified osteoarthritis, unspecified site: Secondary | ICD-10-CM | POA: Diagnosis not present

## 2020-03-13 DIAGNOSIS — R109 Unspecified abdominal pain: Secondary | ICD-10-CM | POA: Insufficient documentation

## 2020-03-13 DIAGNOSIS — Z8042 Family history of malignant neoplasm of prostate: Secondary | ICD-10-CM | POA: Insufficient documentation

## 2020-03-13 DIAGNOSIS — Z79899 Other long term (current) drug therapy: Secondary | ICD-10-CM | POA: Insufficient documentation

## 2020-03-13 DIAGNOSIS — R634 Abnormal weight loss: Secondary | ICD-10-CM | POA: Diagnosis not present

## 2020-03-13 DIAGNOSIS — Z800 Family history of malignant neoplasm of digestive organs: Secondary | ICD-10-CM | POA: Diagnosis not present

## 2020-03-13 DIAGNOSIS — I10 Essential (primary) hypertension: Secondary | ICD-10-CM | POA: Insufficient documentation

## 2020-03-13 DIAGNOSIS — Z85038 Personal history of other malignant neoplasm of large intestine: Secondary | ICD-10-CM | POA: Diagnosis not present

## 2020-03-13 LAB — CBC WITH DIFFERENTIAL/PLATELET
Abs Immature Granulocytes: 0 10*3/uL (ref 0.00–0.07)
Basophils Absolute: 0 10*3/uL (ref 0.0–0.1)
Basophils Relative: 1 %
Eosinophils Absolute: 0.1 10*3/uL (ref 0.0–0.5)
Eosinophils Relative: 2 %
HCT: 47.4 % (ref 39.0–52.0)
Hemoglobin: 15.5 g/dL (ref 13.0–17.0)
Immature Granulocytes: 0 %
Lymphocytes Relative: 39 %
Lymphs Abs: 1.5 10*3/uL (ref 0.7–4.0)
MCH: 29.2 pg (ref 26.0–34.0)
MCHC: 32.7 g/dL (ref 30.0–36.0)
MCV: 89.4 fL (ref 80.0–100.0)
Monocytes Absolute: 0.4 10*3/uL (ref 0.1–1.0)
Monocytes Relative: 9 %
Neutro Abs: 1.9 10*3/uL (ref 1.7–7.7)
Neutrophils Relative %: 49 %
Platelets: 200 10*3/uL (ref 150–400)
RBC: 5.3 MIL/uL (ref 4.22–5.81)
RDW: 13.1 % (ref 11.5–15.5)
WBC: 3.9 10*3/uL — ABNORMAL LOW (ref 4.0–10.5)
nRBC: 0 % (ref 0.0–0.2)

## 2020-03-13 LAB — COMPREHENSIVE METABOLIC PANEL
ALT: 24 U/L (ref 0–44)
AST: 26 U/L (ref 15–41)
Albumin: 4.4 g/dL (ref 3.5–5.0)
Alkaline Phosphatase: 64 U/L (ref 38–126)
Anion gap: 7 (ref 5–15)
BUN: 13 mg/dL (ref 8–23)
CO2: 30 mmol/L (ref 22–32)
Calcium: 9.3 mg/dL (ref 8.9–10.3)
Chloride: 102 mmol/L (ref 98–111)
Creatinine, Ser: 1.03 mg/dL (ref 0.61–1.24)
GFR, Estimated: >60 mL/min (ref >60)
Glucose, Bld: 93 mg/dL (ref 70–99)
Potassium: 4.7 mmol/L (ref 3.5–5.1)
Sodium: 139 mmol/L (ref 135–145)
Total Bilirubin: 0.5 mg/dL (ref 0.3–1.2)
Total Protein: 7.8 g/dL (ref 6.5–8.1)

## 2020-03-13 LAB — LACTATE DEHYDROGENASE: LDH: 141 U/L (ref 98–192)

## 2020-03-13 NOTE — Progress Notes (Signed)
Hematology/Oncology Consult note [facility] [phone] [phone]  Patient Care Team: [name], MD as PCP - General (Family Medicine)   Name of the patient: Travis Santiago  [id number]  04-24-1950    Reason for referral-unintentional weight loss   Referring physician-Dr. 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  Past medical history is significant for hypertension and arthritis.He has a past history of colon cancer back in 2006 for which she had surgery and chemotherapy.  Last colonoscopy in 2018 was unremarkable.  He has now been referred to [place] for unintentional weight loss.  He has lost about 27 pounds in the last 8 months.  Reports that he works from 5 AM to 10 AM and then again from 1 pM to 6 PM.  He eats about 2 meals per day.  Reports that he does not feel like eating anymore and otherwise has a good appetite.  Denies any blood in his stools but does report ongoing issues with constipation.  He was a remote smoker and smoked when he was 28 to 69 years of age but has not smoked over the last 57 years.  Denies any fever cough or shortness of breath.  Reports on and off dull aching abdominal pain which is unrelated to his meals.  Pain was worse about 3 to 4 weeks ago but presently not that bad  ECOG PS- 1  Pain scale- 3   Review of systems- Review of Systems  Constitutional: Positive for weight loss. Negative for chills, fever and malaise/fatigue.  HENT: Negative for congestion, ear discharge and nosebleeds.   Eyes: Negative for blurred vision.  Respiratory: Negative for cough, hemoptysis, sputum production, shortness of breath and wheezing.   Cardiovascular: Negative for chest pain, palpitations, orthopnea and claudication.  Gastrointestinal: Negative for abdominal pain, blood in stool, constipation, diarrhea,  heartburn, melena, nausea and vomiting.  Genitourinary: Negative for dysuria, flank pain, frequency, hematuria and urgency.  Musculoskeletal: Negative for back pain, joint pain and myalgias.  Skin: Negative for rash.  Neurological: Negative for dizziness, tingling, focal weakness, seizures, weakness and headaches.  Endo/Heme/Allergies: Does not bruise/bleed easily.  Psychiatric/Behavioral: Negative for depression and suicidal ideas. The patient does not have insomnia.     Allergies  Allergen Reactions  . Other Anaphylaxis    Nitrates Analogues/food    There are no problems to display for this patient.    Past Medical History:  Diagnosis Date  . Arthritis   . Cancer [facility]) 2007   colon   . Hypertension      Past Surgical History:  Procedure Laterality Date  . COLON SURGERY      Social History   Socioeconomic History  . Marital status: Married    Spouse name: Not on file  . Number of children: Not on file  . Years of education: Not on file  . Highest education level: Not on file  Occupational History  . Not on file  Tobacco Use  . Smoking status: Never Smoker  . Smokeless tobacco: Never Used  Vaping Use  . Vaping Use: Never used  Substance and Sexual Activity  . Alcohol use: Yes    Alcohol/week: 1.0 standard drink    Types: 1 Glasses of wine per week    Comment: social   . Drug use: Never  . Sexual activity: Not Currently  Other Topics Concern  . Not on file  Social History Narrative  . Not on file   Social Determinants of Health   Financial Resource Strain: Not on file  Food Insecurity: Not on file  Transportation Needs: Not on file  Physical Activity: Not on file  Stress: Not on file  Social Connections: Not on file  Intimate Partner Violence: Not on file     Family History  Problem Relation Age of Onset  . Gastric cancer Father   . Prostate cancer Brother      Current Outpatient Medications:  .  losartan (COZAAR) 25 MG tablet, Take 1  tablet by mouth daily., Disp: , Rfl:  .  naproxen sodium (ALEVE) 220 MG tablet, Take by mouth., Disp: , Rfl:  .  acetaminophen (TYLENOL) 325 MG tablet, Take 2 tablets (650 mg total) by mouth every 6 (six) hours as needed. Do not take more than 4000mg  of tylenol per day (Patient not taking: Reported on 03/13/2020), Disp: 30 tablet, Rfl: 0 .  ibuprofen (ADVIL,MOTRIN) 200 MG tablet, Take 1 tablet (200 mg total) by mouth every 6 (six) hours as needed. (Patient not taking: Reported on 03/13/2020), Disp: 30 tablet, Rfl: 0 .  predniSONE (STERAPRED UNI-PAK 21 TAB) 10 MG (21) TBPK tablet, Take by mouth daily. Take 6 tabs by mouth daily  for 2 days, then 5 tabs for 2 days, then 4 tabs for 2 days, then 3 tabs for 2 days, 2 tabs for 2 days, then 1 tab by mouth daily for 2 days (Patient not taking: Reported on 03/13/2020), Disp: 42 tablet, Rfl: 0   Physical exam:  Vitals:   03/13/20 0937  BP: (!) 147/96  Pulse: 71  Resp: 16  Weight: 184 lb 4.8 oz (83.6 kg)  Height: 5' 8.7" (1.745 m)   Physical Exam Constitutional:      General: He is not in acute distress. Eyes:     Extraocular Movements: EOM normal.  Cardiovascular:     Rate and Rhythm: Normal rate and regular rhythm.     Heart sounds: Normal heart sounds.  Pulmonary:     Effort: Pulmonary effort is normal.     Breath sounds: Normal breath sounds.  Abdominal:     Comments: No palpable hepatosplenomegaly  Lymphadenopathy:     Comments: No palpable cervical, supraclavicular, axillary or inguinal adenopathy   Skin:    General: Skin is warm and dry.  Neurological:     Mental Status: He is alert and oriented to person, place, and time.        CMP Latest Ref Rng & Units 03/13/2020  Glucose 70 - 99 mg/dL 93  BUN 8 - 23 mg/dL 13  Creatinine 0.61 - 1.24 mg/dL 1.03  Sodium 135 - 145 mmol/L 139  Potassium 3.5 - 5.1 mmol/L 4.7  Chloride 98 - 111 mmol/L 102  CO2 22 - 32 mmol/L 30  Calcium 8.9 - 10.3 mg/dL 9.3  Total Protein 6.5 - 8.1 g/dL  7.8  Total Bilirubin 0.3 - 1.2 mg/dL 0.5  Alkaline Phos 38 - 126 U/L 64  AST 15 - 41 U/L 26  ALT 0 - 44 U/L 24   CBC Latest Ref Rng & Units 03/13/2020  WBC 4.0 - 10.5 K/uL 3.9(L)  Hemoglobin 13.0 - 17.0 g/dL 15.5  Hematocrit 39.0 - 52.0 % 47.4  Platelets 150 - 400 K/uL 200     Assessment and plan- Patient is a 69 y.o. male with prior history of colon cancer in 2006 now referred for abnormal weight loss  Patient has  lost about 27 pounds in the last 10 months which she states has been unintentional.  Clinically he does not have any palpable adenopathy in his labs from August 2021 were otherwise unremarkable.  I will obtain a CBC with differential CMP and LDH today.  Also obtain CT chest abdomen and pelvis with contrast to rule out any malignancy.  I will see him back after CT scan results are back to discuss further management   Thank you for this kind referral and the opportunity to participate in the care of this patient   Visit Diagnosis 1. Abnormal weight loss   2. History of colon cancer     Dr. Randa Evens, MD, MPH [facility] [phone] 03/13/2020 1:30 PM

## 2020-03-13 NOTE — Progress Notes (Signed)
New patient evaluation for abnormal weight loss.

## 2020-03-29 ENCOUNTER — Telehealth: Payer: Self-pay | Admitting: Oncology

## 2020-03-29 NOTE — Telephone Encounter (Signed)
Pt called and left message for return call, pt did not state what was needed. [phone] for return call.

## 2020-03-30 ENCOUNTER — Ambulatory Visit: Payer: Medicare HMO

## 2020-03-31 ENCOUNTER — Ambulatory Visit
Admission: RE | Admit: 2020-03-31 | Discharge: 2020-03-31 | Disposition: A | Discharge status: Home / Self Care | Payer: Medicare HMO | Source: Ambulatory Visit | Attending: Oncology | Admitting: Oncology

## 2020-03-31 ENCOUNTER — Other Ambulatory Visit: Payer: Self-pay

## 2020-03-31 DIAGNOSIS — R634 Abnormal weight loss: Secondary | ICD-10-CM | POA: Diagnosis not present

## 2020-03-31 DIAGNOSIS — I2510 Atherosclerotic heart disease of native coronary artery without angina pectoris: Secondary | ICD-10-CM | POA: Diagnosis not present

## 2020-03-31 DIAGNOSIS — I700 Atherosclerosis of aorta: Secondary | ICD-10-CM | POA: Insufficient documentation

## 2020-03-31 DIAGNOSIS — J9811 Atelectasis: Secondary | ICD-10-CM | POA: Diagnosis not present

## 2020-03-31 DIAGNOSIS — E278 Other specified disorders of adrenal gland: Secondary | ICD-10-CM | POA: Diagnosis not present

## 2020-03-31 DIAGNOSIS — I871 Compression of vein: Secondary | ICD-10-CM | POA: Diagnosis not present

## 2020-03-31 DIAGNOSIS — D3500 Benign neoplasm of unspecified adrenal gland: Secondary | ICD-10-CM | POA: Diagnosis not present

## 2020-03-31 MED ORDER — IOHEXOL 300 MG/ML  SOLN
100.0000 mL | Freq: Once | INTRAMUSCULAR | Status: AC | PRN
  Administered 2020-03-31: 100 mL via INTRAVENOUS

## 2020-04-04 ENCOUNTER — Ambulatory Visit: Payer: Medicare HMO | Admitting: Oncology

## 2020-04-10 ENCOUNTER — Encounter: Payer: Self-pay | Admitting: Oncology

## 2020-04-10 ENCOUNTER — Inpatient Hospital Stay: Payer: Medicare HMO | Admitting: Oncology

## 2020-04-10 ENCOUNTER — Other Ambulatory Visit: Payer: Self-pay | Admitting: *Deleted

## 2020-04-10 ENCOUNTER — Other Ambulatory Visit: Payer: Self-pay

## 2020-04-10 ENCOUNTER — Inpatient Hospital Stay: Payer: Medicare HMO | Attending: Oncology | Admitting: Oncology

## 2020-04-10 VITALS — BP 157/97 | HR 54 | Temp 96.9°F | Resp 16 | Ht 68.7 in | Wt 191.0 lb

## 2020-04-10 DIAGNOSIS — R1030 Lower abdominal pain, unspecified: Secondary | ICD-10-CM | POA: Insufficient documentation

## 2020-04-10 DIAGNOSIS — Z800 Family history of malignant neoplasm of digestive organs: Secondary | ICD-10-CM | POA: Diagnosis not present

## 2020-04-10 DIAGNOSIS — Z85038 Personal history of other malignant neoplasm of large intestine: Secondary | ICD-10-CM

## 2020-04-10 DIAGNOSIS — I10 Essential (primary) hypertension: Secondary | ICD-10-CM | POA: Insufficient documentation

## 2020-04-10 DIAGNOSIS — Z8042 Family history of malignant neoplasm of prostate: Secondary | ICD-10-CM | POA: Insufficient documentation

## 2020-04-10 DIAGNOSIS — R5383 Other fatigue: Secondary | ICD-10-CM | POA: Diagnosis not present

## 2020-04-10 DIAGNOSIS — Z79899 Other long term (current) drug therapy: Secondary | ICD-10-CM | POA: Diagnosis not present

## 2020-04-10 DIAGNOSIS — Z87891 Personal history of nicotine dependence: Secondary | ICD-10-CM | POA: Insufficient documentation

## 2020-04-10 DIAGNOSIS — I700 Atherosclerosis of aorta: Secondary | ICD-10-CM | POA: Insufficient documentation

## 2020-04-10 DIAGNOSIS — R634 Abnormal weight loss: Secondary | ICD-10-CM | POA: Insufficient documentation

## 2020-04-10 DIAGNOSIS — D3502 Benign neoplasm of left adrenal gland: Secondary | ICD-10-CM

## 2020-04-10 DIAGNOSIS — I2510 Atherosclerotic heart disease of native coronary artery without angina pectoris: Secondary | ICD-10-CM | POA: Diagnosis not present

## 2020-04-10 DIAGNOSIS — M1990 Unspecified osteoarthritis, unspecified site: Secondary | ICD-10-CM | POA: Insufficient documentation

## 2020-04-10 NOTE — Progress Notes (Signed)
Opened in error

## 2020-04-10 NOTE — Progress Notes (Signed)
Hematology/Oncology Consult note [facility]  Telephone:([phone] [phone]  Patient Care Team: Marisue IvanLinthavong, Kanhka, MD as PCP - General (Family Medicine)   Name of the patient: Travis RangerCharles Santiago  [id number]  [birth year]   Date of visit: 04/10/20  Diagnosis-unintentional weight loss possibly secondary to poor oral intake. 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  Past medical history is significant for hypertension and arthritis.He has a past history of colon cancer back in 2006 for which she had surgery and chemotherapy.  Last colonoscopy in 2018 was unremarkable.  He has now been referred to us for unintentional weight loss.  He has lost about 27 pounds in the last 8 months.  Reports that he works from 5 AM to 10 AM and then again from 1 pM to 6 PM.  He eats about 2 meals per day.  Reports that he does not feel like eating anymore and otherwise has a good appetite.  Denies any blood in his stools but does report ongoing issues with constipation.  He was a remote smoker and smoked when he was [age] to 70 years of age but has not smoked over the last 45 years.    Interval history-patient reports that his abdominal pain is resolved. He does have an erratic work schedule which makes it difficult for him to take scheduled laxatives. He sometimes goes for 2 weeks without having a regular bowel movement. Weight is up by 7 pounds today  ECOG PS- 1 Pain scale- 0   Review of systems- Review of Systems  Constitutional: Positive for malaise/fatigue. Negative for chills, fever and weight loss.  HENT: Negative for congestion, ear discharge and nosebleeds.   Eyes: Negative for blurred vision.  Respiratory: Negative for cough, hemoptysis, sputum production, shortness of breath and wheezing.    Cardiovascular: Negative for chest pain, palpitations, orthopnea and claudication.  Gastrointestinal: Negative for abdominal pain, blood in stool, constipation, diarrhea, heartburn, melena, nausea and vomiting.  Genitourinary: Negative for dysuria, flank pain, frequency, hematuria and urgency.  Musculoskeletal: Negative for back pain, joint pain and myalgias.  Skin: Negative for rash.  Neurological: Negative for dizziness, tingling, focal weakness, seizures, weakness and headaches.  Endo/Heme/Allergies: Does not bruise/bleed easily.  Psychiatric/Behavioral: Negative for depression and suicidal ideas. The patient does not have insomnia.        Allergies  Allergen Reactions  . Other Anaphylaxis    Nitrates Analogues/food     Past Medical History:  Diagnosis Date  . Arthritis   . Cancer [facility](HCC) 2007   colon   . Hypertension      Past Surgical History:  Procedure Laterality Date  . COLON SURGERY      Social History   Socioeconomic History  . Marital status: Married    Spouse name: Not on file  . Number of children: Not on file  . Years of education: Not on file  . Highest education level: Not on file  Occupational History  . Not on file  Tobacco Use  . Smoking status: Former Smoker    Quit date: 07/30/1973    Years since quitting: 46.7  . Smokeless tobacco: Never Used  Vaping Use  . Vaping Use: Never used  Substance and Sexual Activity  . Alcohol use: Yes    Alcohol/week: 1.0 standard drink    Types: 1 Glasses of wine  per week    Comment: social about once a year  . Drug use: Never  . Sexual activity: Not Currently  Other Topics Concern  . Not on file  Social History Narrative  . Not on file   Social Determinants of Health   Financial Resource Strain: Not on file  Food Insecurity: Not on file  Transportation Needs: Not on file  Physical Activity: Not on file  Stress: Not on file  Social Connections: Not on file  Intimate Partner Violence: Not on file     Family History  Problem Relation Age of Onset  . Gastric cancer Father   . Prostate cancer Brother      Current Outpatient Medications:  .  acetaminophen (TYLENOL) 325 MG tablet, Take 2 tablets (650 mg total) by mouth every 6 (six) hours as needed. Do not take more than 4000mg  of tylenol per day, Disp: 30 tablet, Rfl: 0 .  losartan (COZAAR) 25 MG tablet, Take 1 tablet by mouth daily., Disp: , Rfl:  .  ibuprofen (ADVIL,MOTRIN) 200 MG tablet, Take 1 tablet (200 mg total) by mouth every 6 (six) hours as needed. (Patient not taking: No sig reported), Disp: 30 tablet, Rfl: 0 .  naproxen sodium (ALEVE) 220 MG tablet, Take by mouth. (Patient not taking: Reported on 04/10/2020), Disp: , Rfl:   Physical exam:  Vitals:   04/10/20 1315  BP: (!) 157/97  Pulse: (!) 54  Resp: 16  Temp: (!) 96.9 F (36.1 C)  TempSrc: Tympanic  Weight: 191 lb (86.6 kg)  Height: 5' 8.7" (1.745 m)   Physical Exam Constitutional:      General: He is not in acute distress. Eyes:     Extraocular Movements: EOM normal.  Cardiovascular:     Rate and Rhythm: Normal rate and regular rhythm.  Pulmonary:     Effort: Pulmonary effort is normal.  Abdominal:     General: Bowel sounds are normal.     Palpations: Abdomen is soft.  Skin:    General: Skin is warm and dry.  Neurological:     Mental Status: He is alert and oriented to person, place, and time.      CMP Latest Ref Rng & Units 03/13/2020  Glucose 70 - 99 mg/dL 93  BUN 8 - 23 mg/dL 13  Creatinine 0.61 - 1.24 mg/dL 1.03  Sodium 135 - 145 mmol/L 139  Potassium 3.5 - 5.1 mmol/L 4.7  Chloride 98 - 111 mmol/L 102  CO2 22 - 32 mmol/L 30  Calcium 8.9 - 10.3 mg/dL 9.3  Total Protein 6.5 - 8.1 g/dL 7.8  Total Bilirubin 0.3 - 1.2 mg/dL 0.5  Alkaline Phos 38 - 126 U/L 64  AST 15 - 41 U/L 26  ALT 0 - 44 U/L 24   CBC Latest Ref Rng & Units 03/13/2020  WBC 4.0 - 10.5 K/uL 3.9(L)  Hemoglobin 13.0 - 17.0 g/dL 15.5  Hematocrit 39.0 - 52.0 % 47.4   Platelets 150 - 400 K/uL 200    No images are attached to the encounter.  CT CHEST ABDOMEN PELVIS W CONTRAST  Result Date: 03/31/2020 CLINICAL DATA:  Abnormal weight loss. Lower abdominal pain since Easter of last year. EXAM: CT CHEST, ABDOMEN, AND PELVIS WITH CONTRAST TECHNIQUE: Multidetector CT imaging of the chest, abdomen and pelvis was performed following the standard protocol during bolus administration of intravenous contrast. CONTRAST:  192mL OMNIPAQUE IOHEXOL 300 MG/ML  SOLN COMPARISON:  No cross-sectional imaging aside from MR lumbar spine from May of  2019 is available. FINDINGS: CT CHEST FINDINGS Cardiovascular: Scattered coronary artery calcifications. Heart size top normal. The no aortic dilation. Central pulmonary vasculature unremarkable on venous phase assessment. Collateral pathways about the LEFT upper chest and axilla, LEFT arm injection performed based on appearance of vasculature. Narrowing of the LEFT brachiocephalic vein is suggested, perhaps related to prior vascular access and resultant narrowing. Mediastinum/Nodes: Thoracic inlet structures are otherwise unremarkable no axillary adenopathy. No mediastinal lymphadenopathy. No hilar lymphadenopathy Lungs/Pleura: No consolidation. No pleural effusion. Basilar atelectasis. Airways are patent. Musculoskeletal: Small fat density area with subtle soft tissue in the anterior chest anterior to the sternum measuring approximately 2.2 x 1.4 cm. Subcutaneous soft tissue density on image 37 of series 2 along the RIGHT anterolateral chest wall measures 13 mm, immediately deep to the chest wall. See below for full musculoskeletal details. CT ABDOMEN PELVIS FINDINGS Hepatobiliary: Normal appearance of the liver. Portal vein is patent. No pericholecystic stranding. No biliary duct dilation. Pancreas: Normal appearance of the pancreas without signs of ductal dilation or inflammation Spleen: Normal Adrenals/Urinary Tract: LEFT adrenal thickening with  nodule measuring 2.3 x 1.6 cm showing low-density, approximately 41 Hounsfield units. Adreniform thickening of the RIGHT adrenal gland. Symmetric renal enhancement. No hydronephrosis. Urinary bladder is under distended limiting assessment. Stomach/Bowel: No acute gastrointestinal process. Post RIGHT hemicolectomy and ileocolonic anastomosis. Vascular/Lymphatic: Patent abdominal vasculature. There is no gastrohepatic or hepatoduodenal ligament lymphadenopathy. No retroperitoneal or mesenteric lymphadenopathy. No pelvic sidewall lymphadenopathy. Reproductive: Prostate unremarkable by CT. Other: Signs of prior midline surgical change.  No ascites. Musculoskeletal: No acute musculoskeletal process. Spinal degenerative changes. Evidence of prior trauma to the RIGHT iliac crest and or prior muscular avulsion, chronic appearance. IMPRESSION: 1. No acute findings to explain weight loss. 2. Fat density area in the midline of the anterior chest may represent a small atypical lipomatous lesion or fat necrosis. This may be amenable to direct clinical inspection. Follow-up imaging could be performed in 6 months particularly if there is enlargement. 3. Subcutaneous soft tissue density along the RIGHT anterolateral chest wall is likely a sebaceous cyst. This could also be assessed by direct clinical inspection. 4. LEFT adrenal thickening with low-density nodule measuring 2.3 x 1.6 cm. Adreniform thickening of the RIGHT adrenal gland. Findings would favor adrenal adenoma though with incomplete characterization on the current study. Given the patient has a cancer history and there are no priors for comparison could consider adrenal washout CT or MRI evaluation with without contrast for further assessment. 5. Collateral pathways about the LEFT upper chest and axilla, LEFT arm injection performed based on appearance of vasculature. Narrowing of the LEFT brachiocephalic vein is suggested, perhaps related to prior vascular access and  resultant narrowing. 6. Post RIGHT hemicolectomy and ileocolonic anastomosis. 7. Coronary artery calcifications. 8. Aortic atherosclerosis. Aortic Atherosclerosis (ICD10-I70.0). Electronically Signed   By: Zetta Bills M.D.   On: 03/31/2020 14:07     Assessment and plan- Patient is a 70 y.o. male with prior history of colon cancer in 2006 referred for unintentional weight loss  I have reviewed CT chest abdomen pelvis images independently and discussed findings with the patient. CT chest showed Calcifications in the coronary arteries which he will need to discuss further with Dr. [name] when she is warranted. Also noted to have atypical lipomatous lesion/area of fat necrosis in the anterior chest which does not require any follow-up. Patient also noted to have an incidental 2.3 cm left adrenal thickening which was more consistent with an adrenal adenoma. I would like  to follow-up on this finding with a repeat CT scan in 6 months and I will see him thereafter. As such the CT scan does not show any evidence of malignancy that would explain his weight loss. I have encouraged the patient to follow a regular eating schedule if possible.  Encouraged the patient to follow a regular laxative schedule to make sure that his bowel movements are regular. If he continues to have problems with bowel movements he should speak to GI   Visit Diagnosis 1. Adrenal adenoma, left      Dr. Randa Evens, MD, MPH [facility] [phone] 04/10/2020 2:07 PM

## 2020-05-25 DIAGNOSIS — R7303 Prediabetes: Secondary | ICD-10-CM | POA: Diagnosis not present

## 2020-05-25 DIAGNOSIS — Z87898 Personal history of other specified conditions: Secondary | ICD-10-CM | POA: Diagnosis not present

## 2020-05-25 DIAGNOSIS — Z136 Encounter for screening for cardiovascular disorders: Secondary | ICD-10-CM | POA: Diagnosis not present

## 2020-05-25 DIAGNOSIS — I10 Essential (primary) hypertension: Secondary | ICD-10-CM | POA: Diagnosis not present

## 2020-05-25 DIAGNOSIS — K5909 Other constipation: Secondary | ICD-10-CM | POA: Diagnosis not present

## 2020-07-05 DIAGNOSIS — H52209 Unspecified astigmatism, unspecified eye: Secondary | ICD-10-CM | POA: Diagnosis not present

## 2020-07-05 DIAGNOSIS — H524 Presbyopia: Secondary | ICD-10-CM | POA: Diagnosis not present

## 2020-07-05 DIAGNOSIS — H5203 Hypermetropia, bilateral: Secondary | ICD-10-CM | POA: Diagnosis not present

## 2020-10-02 ENCOUNTER — Telehealth: Payer: Self-pay | Admitting: Oncology

## 2020-10-02 ENCOUNTER — Ambulatory Visit: Admission: RE | Admit: 2020-10-02 | Payer: Medicare HMO | Source: Ambulatory Visit

## 2020-10-02 NOTE — Telephone Encounter (Signed)
Attempt made to reach patient about today's missed CT scan. Mailbox was full and I was unable to leave a message. CT and MD f/u will need to be rescheduled.

## 2020-10-05 ENCOUNTER — Telehealth: Payer: Self-pay | Admitting: Oncology

## 2020-10-05 NOTE — Telephone Encounter (Signed)
Attempt made to call patient to let him know about rescheduling CT scan and f/u made with MD. Appointments made on Friday as patient requested. Sending AVS in the mail.

## 2020-10-09 ENCOUNTER — Ambulatory Visit: Payer: Medicare HMO | Admitting: Oncology

## 2020-10-27 ENCOUNTER — Other Ambulatory Visit: Payer: Self-pay

## 2020-10-27 ENCOUNTER — Ambulatory Visit
Admission: RE | Admit: 2020-10-27 | Discharge: 2020-10-27 | Disposition: A | Discharge status: Home / Self Care | Payer: Medicare HMO | Source: Ambulatory Visit | Attending: Oncology | Admitting: Oncology

## 2020-10-27 DIAGNOSIS — D3502 Benign neoplasm of left adrenal gland: Secondary | ICD-10-CM | POA: Diagnosis not present

## 2020-10-27 DIAGNOSIS — Z85038 Personal history of other malignant neoplasm of large intestine: Secondary | ICD-10-CM | POA: Diagnosis not present

## 2020-10-27 DIAGNOSIS — E278 Other specified disorders of adrenal gland: Secondary | ICD-10-CM | POA: Diagnosis not present

## 2020-10-27 DIAGNOSIS — D3500 Benign neoplasm of unspecified adrenal gland: Secondary | ICD-10-CM | POA: Diagnosis not present

## 2020-10-27 DIAGNOSIS — R634 Abnormal weight loss: Secondary | ICD-10-CM | POA: Diagnosis not present

## 2020-10-27 LAB — POCT I-STAT CREATININE: Creatinine, Ser: 1.2 mg/dL (ref 0.61–1.24)

## 2020-10-27 MED ORDER — IOHEXOL 350 MG/ML SOLN
100.0000 mL | Freq: Once | INTRAVENOUS | Status: AC | PRN
  Administered 2020-10-27: 100 mL via INTRAVENOUS

## 2020-11-03 ENCOUNTER — Other Ambulatory Visit: Payer: Self-pay

## 2020-11-03 ENCOUNTER — Inpatient Hospital Stay: Payer: Medicare HMO | Attending: Oncology | Admitting: Hospice and Palliative Medicine

## 2020-11-03 VITALS — BP 119/93 | HR 75 | Temp 97.3°F | Resp 18 | Wt 181.5 lb

## 2020-11-03 DIAGNOSIS — Z79899 Other long term (current) drug therapy: Secondary | ICD-10-CM | POA: Diagnosis not present

## 2020-11-03 DIAGNOSIS — R634 Abnormal weight loss: Secondary | ICD-10-CM | POA: Diagnosis not present

## 2020-11-03 DIAGNOSIS — I10 Essential (primary) hypertension: Secondary | ICD-10-CM | POA: Diagnosis not present

## 2020-11-03 DIAGNOSIS — E278 Other specified disorders of adrenal gland: Secondary | ICD-10-CM | POA: Insufficient documentation

## 2020-11-03 DIAGNOSIS — Z9049 Acquired absence of other specified parts of digestive tract: Secondary | ICD-10-CM | POA: Insufficient documentation

## 2020-11-03 DIAGNOSIS — Z85038 Personal history of other malignant neoplasm of large intestine: Secondary | ICD-10-CM | POA: Diagnosis not present

## 2020-11-03 DIAGNOSIS — K5900 Constipation, unspecified: Secondary | ICD-10-CM | POA: Diagnosis not present

## 2020-11-03 NOTE — Progress Notes (Signed)
Pt is here today for his CT scan results. Reports no changes in his health since last visit.

## 2020-11-03 NOTE — Progress Notes (Signed)
Hematology/Oncology Consult note [facility]  Telephone:([phone] Fax:([phone]  Patient Care Team: [name], MD as PCP - General (Family Medicine)   Name of the patient: Travis Santiago  [id number]  [birth year]   Date of visit: 11/03/20  Diagnosis-unintentional weight loss possibly secondary to poor oral intake. 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  Past medical history is significant for hypertension and arthritis.He has a past history of colon cancer back in 2006 for which she had surgery and chemotherapy.  Last colonoscopy in 2018 was unremarkable.  He has now been referred to [place] for unintentional weight loss.  He has lost about 27 pounds in the last 8 months.  Reports that he works from 5 AM to 10 AM and then again from 1 pM to 6 PM.  He eats about 2 meals per day.  Reports that he does not feel like eating anymore and otherwise has a good appetite.  Denies any blood in his stools but does report ongoing issues with constipation.  He was a remote smoker and smoked when he was 48 to 70 years of age but has not smoked over the last 90 years.    Interval history-patient reports since last being seen, he has been doing well.  He denies any significant changes or concerns today.  He denies any symptomatic complaints.  He reports that he has lost weight but it sounds like most recent weight loss has been intentional.  Patient says that he has been trying to watch what he eats and consuming less food at buffets and less sweets.  Patient says that he is now at his preferred weight of around 180 pounds.  Patient says that his constipation has improved.  He has now using MiraLAX and Benefiber as needed and having a bowel movement every few days.  ECOG PS- 1 Pain scale-  0   Review of systems- Review of Systems  Constitutional:  Negative for chills, fever, malaise/fatigue and weight loss.  HENT:  Negative for ear pain.   Eyes:  Negative for blurred vision.  Respiratory:  Negative for cough, hemoptysis, sputum production, shortness of breath and wheezing.   Cardiovascular:  Negative for chest pain, palpitations, orthopnea and claudication.  Gastrointestinal:  Negative for abdominal pain, blood in stool, constipation, diarrhea, heartburn, melena, nausea and vomiting.  Genitourinary:  Negative for dysuria, flank pain, frequency, hematuria and urgency.  Musculoskeletal:  Negative for back pain, joint pain and myalgias.  Skin:  Negative for rash.  Neurological:  Negative for dizziness, tingling, focal weakness, seizures, weakness and headaches.  Endo/Heme/Allergies:  Does not bruise/bleed easily.      Allergies  Allergen Reactions   Other Anaphylaxis    Nitrates Analogues/food     Past Medical History:  Diagnosis Date   Arthritis    Cancer ([place]) 2007   colon    Hypertension      Past Surgical History:  Procedure Laterality Date   COLON SURGERY      Social History   Socioeconomic History   Marital status: Married    Spouse name: Not on file   Number of children: Not on file   Years of education: Not on file   Highest education level: Not on file  Occupational History   Not on file  Tobacco Use   Smoking status: Former  Types: Cigarettes    Quit date: 07/30/1973    Years since quitting: 47.2   Smokeless tobacco: Never  Vaping Use   Vaping Use: Never used  Substance and Sexual Activity   Alcohol use: Yes    Alcohol/week: 1.0 standard drink    Types: 1 Glasses of wine per week    Comment: social about once a year   Drug use: Never   Sexual activity: Not Currently  Other Topics Concern   Not on file  Social History Narrative   Not on file   Social Determinants of Health   Financial Resource Strain: Not on file  Food  Insecurity: Not on file  Transportation Needs: Not on file  Physical Activity: Not on file  Stress: Not on file  Social Connections: Not on file  Intimate Partner Violence: Not on file    Family History  Problem Relation Age of Onset   Gastric cancer Father    Prostate cancer Brother      Current Outpatient Medications:    acetaminophen (TYLENOL) 325 MG tablet, Take 2 tablets (650 mg total) by mouth every 6 (six) hours as needed. Do not take more than '4000mg'$  of tylenol per day, Disp: 30 tablet, Rfl: 0   ibuprofen (ADVIL,MOTRIN) 200 MG tablet, Take 1 tablet (200 mg total) by mouth every 6 (six) hours as needed., Disp: 30 tablet, Rfl: 0   losartan (COZAAR) 25 MG tablet, Take 1 tablet by mouth daily., Disp: , Rfl:    naproxen sodium (ALEVE) 220 MG tablet, Take by mouth., Disp: , Rfl:   Physical exam:  Vitals:   11/03/20 1139  BP: (!) 119/93  Pulse: 75  Resp: 18  Temp: (!) 97.3 F (36.3 C)  TempSrc: Tympanic  SpO2: 97%  Weight: 181 lb 8 oz (82.3 kg)   Physical Exam Constitutional:      General: He is not in acute distress.    Appearance: Normal appearance.  Cardiovascular:     Rate and Rhythm: Normal rate and regular rhythm.  Pulmonary:     Effort: Pulmonary effort is normal.  Abdominal:     General: Bowel sounds are normal.     Palpations: Abdomen is soft.  Musculoskeletal:        General: No swelling.  Skin:    General: Skin is warm and dry.  Neurological:     General: No focal deficit present.     Mental Status: He is alert and oriented to person, place, and time.     CMP Latest Ref Rng & Units 10/27/2020  Glucose 70 - 99 mg/dL -  BUN 8 - 23 mg/dL -  Creatinine 0.61 - 1.24 mg/dL 1.20  Sodium 135 - 145 mmol/L -  Potassium 3.5 - 5.1 mmol/L -  Chloride 98 - 111 mmol/L -  CO2 22 - 32 mmol/L -  Calcium 8.9 - 10.3 mg/dL -  Total Protein 6.5 - 8.1 g/dL -  Total Bilirubin 0.3 - 1.2 mg/dL -  Alkaline Phos 38 - 126 U/L -  AST 15 - 41 U/L -  ALT 0 - 44 U/L -    CBC Latest Ref Rng & Units 03/13/2020  WBC 4.0 - 10.5 K/uL 3.9(L)  Hemoglobin 13.0 - 17.0 g/dL 15.5  Hematocrit 39.0 - 52.0 % 47.4  Platelets 150 - 400 K/uL 200    No images are attached to the encounter.  CT Abdomen Pelvis W Wo Contrast  Result Date: 10/30/2020 CLINICAL DATA:  Follow up left adrenal nodule. History  of colon cancer with weight loss. EXAM: CT ABDOMEN AND PELVIS WITHOUT AND WITH CONTRAST TECHNIQUE: Multidetector CT imaging of the abdomen and pelvis was performed following the standard protocol before and following the bolus administration of intravenous contrast. CONTRAST:  175m OMNIPAQUE IOHEXOL 350 MG/ML SOLN COMPARISON:  Abdominopelvic CT 03/31/2020 FINDINGS: Lower chest: Clear lung bases. No significant pleural or pericardial effusion. Coronary artery atherosclerosis noted. Fatty lesion superiorly in the right breast and anterior to the sternum are unchanged, consistent with benign findings. Stable probable sebaceous cyst inferiorly in the right breast. Hepatobiliary: The liver is normal in density without suspicious focal abnormality. No evidence of gallstones, gallbladder wall thickening or biliary dilatation. Pancreas: Unremarkable. No pancreatic ductal dilatation or surrounding inflammatory changes. Spleen: Normal in size without focal abnormality. Adrenals/Urinary Tract: Stable low-density nodularity of both adrenal glands consistent with hyperplasia or adenomas. No suspicious enhancement. The kidneys appear normal without evidence of urinary tract calculus, suspicious lesion or hydronephrosis. No bladder abnormalities are seen. Stomach/Bowel: Enteric contrast was administered and has passed into the proximal colon. Stable postsurgical changes from previous right hemicolectomy. The stomach appears unremarkable for its degree of distension. No evidence of bowel wall thickening, distention or surrounding inflammatory change. Vascular/Lymphatic: There are no enlarged abdominal or  pelvic lymph nodes. No significant vascular findings. Reproductive: The prostate gland and seminal vesicles appear unremarkable. Other: Stable postsurgical changes in the anterior abdominal wall. No hernia, ascites or peritoneal nodularity. Musculoskeletal: No acute or significant osseous findings. As above, stable subcutaneous nodules in the lower anterior chest wall. IMPRESSION: 1. No acute findings or explanation for the patient's symptoms. No evidence of metastatic colon cancer. 2. Stable bilateral adrenal hyperplasia/adenomas. 3. Stable fatty and soft tissue nodules in the lower anterior chest wall consistent with benign findings (likely lipomas and a sebaceous cyst). Electronically Signed   By: WRichardean SaleM.D.   On: 10/30/2020 08:54      Assessment and plan- Patient is a 70y.o. male with prior history of colon cancer in 2006 referred for unintentional weight loss  Patient appears to be doing well clinically without any symptomatic concerns or changes in his health since last seen.  His weight without 181 pounds today, which was down approximately 10 pounds since January.  However, patient has had some intentional dietary changes including avoiding buffets/sweets, which likely explain further weight loss.  Recommended patient and PCP continue following weight trends.  I have reviewed CT chest abdomen pelvis images independently and discussed findings with the patient.   CT of the chest, abdomen, and pelvis on 10/27/2020 did not reveal any acute findings and was without evidence of metastatic disease.  Bilateral adrenal hyperplasia/adenomatous was stable in appearance.  Patient also had stable fatty and soft tissue nodules in the lower anterior chest wall.  I spoke with radiologist -Dr. VLin Landsmanand no follow-up radiological imaging was recommended.  Discussed with Dr. RJanese Banks   [facility]  Will defer routine cancer  screening to PCP.   Visit Diagnosis 1. Abnormal weight loss      JAltha Harm NP-C [id number]/02/2021 12:47 PM

## 2020-11-22 DIAGNOSIS — Z136 Encounter for screening for cardiovascular disorders: Secondary | ICD-10-CM | POA: Diagnosis not present

## 2020-11-22 DIAGNOSIS — R7303 Prediabetes: Secondary | ICD-10-CM | POA: Diagnosis not present

## 2020-11-22 DIAGNOSIS — I10 Essential (primary) hypertension: Secondary | ICD-10-CM | POA: Diagnosis not present

## 2020-11-29 DIAGNOSIS — R7303 Prediabetes: Secondary | ICD-10-CM | POA: Diagnosis not present

## 2020-11-29 DIAGNOSIS — Z0000 Encounter for general adult medical examination without abnormal findings: Secondary | ICD-10-CM | POA: Diagnosis not present

## 2020-11-29 DIAGNOSIS — Z1389 Encounter for screening for other disorder: Secondary | ICD-10-CM | POA: Diagnosis not present

## 2020-11-29 DIAGNOSIS — I10 Essential (primary) hypertension: Secondary | ICD-10-CM | POA: Diagnosis not present

## 2021-01-30 DIAGNOSIS — H5200 Hypermetropia, unspecified eye: Secondary | ICD-10-CM | POA: Diagnosis not present

## 2021-01-30 DIAGNOSIS — I10 Essential (primary) hypertension: Secondary | ICD-10-CM | POA: Diagnosis not present

## 2021-01-30 DIAGNOSIS — Z0100 Encounter for examination of eyes and vision without abnormal findings: Secondary | ICD-10-CM | POA: Diagnosis not present

## 2021-02-02 DIAGNOSIS — M48062 Spinal stenosis, lumbar region with neurogenic claudication: Secondary | ICD-10-CM | POA: Diagnosis not present

## 2021-02-02 DIAGNOSIS — M5416 Radiculopathy, lumbar region: Secondary | ICD-10-CM | POA: Diagnosis not present

## 2021-02-02 DIAGNOSIS — M5136 Other intervertebral disc degeneration, lumbar region: Secondary | ICD-10-CM | POA: Diagnosis not present

## 2021-02-02 DIAGNOSIS — M47819 Spondylosis without myelopathy or radiculopathy, site unspecified: Secondary | ICD-10-CM | POA: Diagnosis not present

## 2021-03-13 DIAGNOSIS — M48062 Spinal stenosis, lumbar region with neurogenic claudication: Secondary | ICD-10-CM | POA: Diagnosis not present

## 2021-03-13 DIAGNOSIS — M5416 Radiculopathy, lumbar region: Secondary | ICD-10-CM | POA: Diagnosis not present

## 2021-04-04 DIAGNOSIS — M5136 Other intervertebral disc degeneration, lumbar region: Secondary | ICD-10-CM | POA: Diagnosis not present

## 2021-04-04 DIAGNOSIS — M5416 Radiculopathy, lumbar region: Secondary | ICD-10-CM | POA: Diagnosis not present

## 2021-04-04 DIAGNOSIS — M48062 Spinal stenosis, lumbar region with neurogenic claudication: Secondary | ICD-10-CM | POA: Diagnosis not present

## 2021-04-09 DIAGNOSIS — R2689 Other abnormalities of gait and mobility: Secondary | ICD-10-CM | POA: Diagnosis not present

## 2021-04-09 DIAGNOSIS — R29898 Other symptoms and signs involving the musculoskeletal system: Secondary | ICD-10-CM | POA: Diagnosis not present

## 2021-04-09 DIAGNOSIS — R498 Other voice and resonance disorders: Secondary | ICD-10-CM | POA: Diagnosis not present

## 2021-04-09 DIAGNOSIS — R490 Dysphonia: Secondary | ICD-10-CM | POA: Diagnosis not present

## 2021-04-09 DIAGNOSIS — R262 Difficulty in walking, not elsewhere classified: Secondary | ICD-10-CM | POA: Diagnosis not present

## 2021-05-22 DIAGNOSIS — R7303 Prediabetes: Secondary | ICD-10-CM | POA: Diagnosis not present

## 2021-05-22 DIAGNOSIS — I10 Essential (primary) hypertension: Secondary | ICD-10-CM | POA: Diagnosis not present

## 2021-05-29 DIAGNOSIS — R7303 Prediabetes: Secondary | ICD-10-CM | POA: Diagnosis not present

## 2021-05-29 DIAGNOSIS — I10 Essential (primary) hypertension: Secondary | ICD-10-CM | POA: Diagnosis not present

## 2021-05-29 DIAGNOSIS — Z136 Encounter for screening for cardiovascular disorders: Secondary | ICD-10-CM | POA: Diagnosis not present

## 2021-05-29 DIAGNOSIS — R2689 Other abnormalities of gait and mobility: Secondary | ICD-10-CM | POA: Insufficient documentation

## 2021-06-07 DIAGNOSIS — R2689 Other abnormalities of gait and mobility: Secondary | ICD-10-CM | POA: Diagnosis not present

## 2021-06-07 DIAGNOSIS — R29898 Other symptoms and signs involving the musculoskeletal system: Secondary | ICD-10-CM | POA: Diagnosis not present

## 2021-06-07 DIAGNOSIS — R498 Other voice and resonance disorders: Secondary | ICD-10-CM | POA: Diagnosis not present

## 2021-06-07 DIAGNOSIS — R262 Difficulty in walking, not elsewhere classified: Secondary | ICD-10-CM | POA: Diagnosis not present

## 2021-06-07 DIAGNOSIS — R490 Dysphonia: Secondary | ICD-10-CM | POA: Diagnosis not present

## 2021-06-15 DIAGNOSIS — I10 Essential (primary) hypertension: Secondary | ICD-10-CM | POA: Diagnosis not present

## 2021-06-15 DIAGNOSIS — R42 Dizziness and giddiness: Secondary | ICD-10-CM | POA: Diagnosis not present

## 2021-07-19 DIAGNOSIS — R262 Difficulty in walking, not elsewhere classified: Secondary | ICD-10-CM | POA: Diagnosis not present

## 2021-07-19 DIAGNOSIS — R498 Other voice and resonance disorders: Secondary | ICD-10-CM | POA: Diagnosis not present

## 2021-07-19 DIAGNOSIS — F411 Generalized anxiety disorder: Secondary | ICD-10-CM | POA: Diagnosis not present

## 2021-07-19 DIAGNOSIS — G20 Parkinson's disease: Secondary | ICD-10-CM | POA: Diagnosis not present

## 2021-07-19 DIAGNOSIS — R29898 Other symptoms and signs involving the musculoskeletal system: Secondary | ICD-10-CM | POA: Diagnosis not present

## 2021-07-19 DIAGNOSIS — R2689 Other abnormalities of gait and mobility: Secondary | ICD-10-CM | POA: Diagnosis not present

## 2021-07-19 DIAGNOSIS — R490 Dysphonia: Secondary | ICD-10-CM | POA: Diagnosis not present

## 2021-08-03 DIAGNOSIS — M48062 Spinal stenosis, lumbar region with neurogenic claudication: Secondary | ICD-10-CM | POA: Diagnosis not present

## 2021-08-03 DIAGNOSIS — M5416 Radiculopathy, lumbar region: Secondary | ICD-10-CM | POA: Diagnosis not present

## 2021-08-03 DIAGNOSIS — M5136 Other intervertebral disc degeneration, lumbar region: Secondary | ICD-10-CM | POA: Diagnosis not present

## 2021-08-07 DIAGNOSIS — R42 Dizziness and giddiness: Secondary | ICD-10-CM | POA: Diagnosis not present

## 2021-08-09 DIAGNOSIS — R42 Dizziness and giddiness: Secondary | ICD-10-CM | POA: Diagnosis not present

## 2021-08-09 DIAGNOSIS — I10 Essential (primary) hypertension: Secondary | ICD-10-CM | POA: Diagnosis not present

## 2021-08-09 DIAGNOSIS — R259 Unspecified abnormal involuntary movements: Secondary | ICD-10-CM | POA: Diagnosis not present

## 2021-10-18 DIAGNOSIS — G8929 Other chronic pain: Secondary | ICD-10-CM | POA: Diagnosis not present

## 2021-10-18 DIAGNOSIS — M545 Low back pain, unspecified: Secondary | ICD-10-CM | POA: Diagnosis not present

## 2021-10-18 DIAGNOSIS — R29898 Other symptoms and signs involving the musculoskeletal system: Secondary | ICD-10-CM | POA: Diagnosis not present

## 2021-10-18 DIAGNOSIS — R262 Difficulty in walking, not elsewhere classified: Secondary | ICD-10-CM | POA: Diagnosis not present

## 2021-10-18 DIAGNOSIS — F411 Generalized anxiety disorder: Secondary | ICD-10-CM | POA: Diagnosis not present

## 2021-10-18 DIAGNOSIS — R490 Dysphonia: Secondary | ICD-10-CM | POA: Diagnosis not present

## 2021-10-18 DIAGNOSIS — G20 Parkinson's disease: Secondary | ICD-10-CM | POA: Diagnosis not present

## 2021-10-18 DIAGNOSIS — R498 Other voice and resonance disorders: Secondary | ICD-10-CM | POA: Diagnosis not present

## 2021-10-18 DIAGNOSIS — R2689 Other abnormalities of gait and mobility: Secondary | ICD-10-CM | POA: Diagnosis not present

## 2021-10-22 ENCOUNTER — Other Ambulatory Visit (HOSPITAL_COMMUNITY): Payer: Self-pay | Admitting: Physician Assistant

## 2021-10-22 ENCOUNTER — Other Ambulatory Visit: Payer: Self-pay | Admitting: Physician Assistant

## 2021-10-22 DIAGNOSIS — R262 Difficulty in walking, not elsewhere classified: Secondary | ICD-10-CM

## 2021-10-22 DIAGNOSIS — G8929 Other chronic pain: Secondary | ICD-10-CM

## 2021-11-11 ENCOUNTER — Ambulatory Visit
Admission: RE | Admit: 2021-11-11 | Discharge: 2021-11-11 | Disposition: A | Discharge status: Home / Self Care | Payer: Medicare HMO | Source: Ambulatory Visit | Attending: Physician Assistant | Admitting: Physician Assistant

## 2021-11-11 DIAGNOSIS — G8929 Other chronic pain: Secondary | ICD-10-CM | POA: Insufficient documentation

## 2021-11-11 DIAGNOSIS — R262 Difficulty in walking, not elsewhere classified: Secondary | ICD-10-CM | POA: Insufficient documentation

## 2021-11-11 DIAGNOSIS — M545 Low back pain, unspecified: Secondary | ICD-10-CM | POA: Insufficient documentation

## 2021-11-16 ENCOUNTER — Other Ambulatory Visit: Payer: Medicare HMO

## 2021-12-20 DIAGNOSIS — I10 Essential (primary) hypertension: Secondary | ICD-10-CM | POA: Diagnosis not present

## 2021-12-20 DIAGNOSIS — R7303 Prediabetes: Secondary | ICD-10-CM | POA: Diagnosis not present

## 2021-12-20 DIAGNOSIS — Z136 Encounter for screening for cardiovascular disorders: Secondary | ICD-10-CM | POA: Diagnosis not present

## 2021-12-27 DIAGNOSIS — I10 Essential (primary) hypertension: Secondary | ICD-10-CM | POA: Diagnosis not present

## 2021-12-27 DIAGNOSIS — Z1331 Encounter for screening for depression: Secondary | ICD-10-CM | POA: Diagnosis not present

## 2021-12-27 DIAGNOSIS — E785 Hyperlipidemia, unspecified: Secondary | ICD-10-CM | POA: Diagnosis not present

## 2021-12-27 DIAGNOSIS — Z6832 Body mass index (BMI) 32.0-32.9, adult: Secondary | ICD-10-CM | POA: Diagnosis not present

## 2021-12-27 DIAGNOSIS — Z0000 Encounter for general adult medical examination without abnormal findings: Secondary | ICD-10-CM | POA: Diagnosis not present

## 2021-12-27 DIAGNOSIS — E669 Obesity, unspecified: Secondary | ICD-10-CM | POA: Diagnosis not present

## 2021-12-27 DIAGNOSIS — E7800 Pure hypercholesterolemia, unspecified: Secondary | ICD-10-CM | POA: Insufficient documentation

## 2021-12-27 DIAGNOSIS — R7303 Prediabetes: Secondary | ICD-10-CM | POA: Diagnosis not present

## 2022-01-16 DIAGNOSIS — R490 Dysphonia: Secondary | ICD-10-CM | POA: Diagnosis not present

## 2022-01-16 DIAGNOSIS — R498 Other voice and resonance disorders: Secondary | ICD-10-CM | POA: Diagnosis not present

## 2022-01-16 DIAGNOSIS — M545 Low back pain, unspecified: Secondary | ICD-10-CM | POA: Diagnosis not present

## 2022-01-16 DIAGNOSIS — F411 Generalized anxiety disorder: Secondary | ICD-10-CM | POA: Diagnosis not present

## 2022-01-16 DIAGNOSIS — R29898 Other symptoms and signs involving the musculoskeletal system: Secondary | ICD-10-CM | POA: Diagnosis not present

## 2022-01-16 DIAGNOSIS — R2689 Other abnormalities of gait and mobility: Secondary | ICD-10-CM | POA: Diagnosis not present

## 2022-01-16 DIAGNOSIS — G20C Parkinsonism, unspecified: Secondary | ICD-10-CM | POA: Diagnosis not present

## 2022-01-16 DIAGNOSIS — G8929 Other chronic pain: Secondary | ICD-10-CM | POA: Diagnosis not present

## 2022-01-16 DIAGNOSIS — R262 Difficulty in walking, not elsewhere classified: Secondary | ICD-10-CM | POA: Diagnosis not present

## 2022-02-01 DIAGNOSIS — M5416 Radiculopathy, lumbar region: Secondary | ICD-10-CM | POA: Diagnosis not present

## 2022-02-01 DIAGNOSIS — M48062 Spinal stenosis, lumbar region with neurogenic claudication: Secondary | ICD-10-CM | POA: Diagnosis not present

## 2022-02-01 DIAGNOSIS — M47819 Spondylosis without myelopathy or radiculopathy, site unspecified: Secondary | ICD-10-CM | POA: Diagnosis not present

## 2022-02-01 DIAGNOSIS — M5136 Other intervertebral disc degeneration, lumbar region: Secondary | ICD-10-CM | POA: Diagnosis not present

## 2022-02-04 DIAGNOSIS — F419 Anxiety disorder, unspecified: Secondary | ICD-10-CM | POA: Diagnosis not present

## 2022-02-04 DIAGNOSIS — M5136 Other intervertebral disc degeneration, lumbar region: Secondary | ICD-10-CM | POA: Diagnosis not present

## 2022-02-04 DIAGNOSIS — K219 Gastro-esophageal reflux disease without esophagitis: Secondary | ICD-10-CM | POA: Diagnosis not present

## 2022-02-04 DIAGNOSIS — I10 Essential (primary) hypertension: Secondary | ICD-10-CM | POA: Diagnosis not present

## 2022-02-04 DIAGNOSIS — G20C Parkinsonism, unspecified: Secondary | ICD-10-CM | POA: Diagnosis not present

## 2022-02-04 DIAGNOSIS — G479 Sleep disorder, unspecified: Secondary | ICD-10-CM | POA: Diagnosis not present

## 2022-02-04 DIAGNOSIS — M48061 Spinal stenosis, lumbar region without neurogenic claudication: Secondary | ICD-10-CM | POA: Diagnosis not present

## 2022-04-02 DIAGNOSIS — R2689 Other abnormalities of gait and mobility: Secondary | ICD-10-CM | POA: Diagnosis not present

## 2022-04-02 DIAGNOSIS — R498 Other voice and resonance disorders: Secondary | ICD-10-CM | POA: Diagnosis not present

## 2022-04-02 DIAGNOSIS — E7800 Pure hypercholesterolemia, unspecified: Secondary | ICD-10-CM | POA: Diagnosis not present

## 2022-04-02 DIAGNOSIS — F411 Generalized anxiety disorder: Secondary | ICD-10-CM | POA: Diagnosis not present

## 2022-04-02 DIAGNOSIS — G8929 Other chronic pain: Secondary | ICD-10-CM | POA: Diagnosis not present

## 2022-04-02 DIAGNOSIS — I10 Essential (primary) hypertension: Secondary | ICD-10-CM | POA: Diagnosis not present

## 2022-04-02 DIAGNOSIS — M545 Low back pain, unspecified: Secondary | ICD-10-CM | POA: Diagnosis not present

## 2022-04-02 DIAGNOSIS — R7303 Prediabetes: Secondary | ICD-10-CM | POA: Diagnosis not present

## 2022-04-02 DIAGNOSIS — R262 Difficulty in walking, not elsewhere classified: Secondary | ICD-10-CM | POA: Diagnosis not present

## 2022-04-09 DIAGNOSIS — E6609 Other obesity due to excess calories: Secondary | ICD-10-CM | POA: Diagnosis not present

## 2022-04-09 DIAGNOSIS — E7800 Pure hypercholesterolemia, unspecified: Secondary | ICD-10-CM | POA: Diagnosis not present

## 2022-04-09 DIAGNOSIS — Z6833 Body mass index (BMI) 33.0-33.9, adult: Secondary | ICD-10-CM | POA: Diagnosis not present

## 2022-04-09 DIAGNOSIS — I10 Essential (primary) hypertension: Secondary | ICD-10-CM | POA: Diagnosis not present

## 2022-04-09 DIAGNOSIS — R7303 Prediabetes: Secondary | ICD-10-CM | POA: Diagnosis not present

## 2022-04-16 ENCOUNTER — Ambulatory Visit: Payer: Medicare HMO | Admitting: Neurosurgery

## 2022-05-06 NOTE — Progress Notes (Unsigned)
Referring Physician:  Harvest Dark, FNP [address] [place],  [place] [address]  Primary Physician:  [name], MD  History of Present Illness: 05/07/2022 [name] is here today with a chief complaint  bilateral low back pain with radiation to bilateral buttock, posterior thighs and calves and hip pain.  He has been having progressive pain for the last 5 years.  He reports sharp and aching pain sometimes tingling and numbness.  He has decreased sensation in his legs.  Standing and walking make his symptoms worse prickly pain in his buttocks and the back of his legs.  He feels a little bit off balance due to his discomfort.  He can only stand in 1 position for approximately 5 minutes until he has pain that causes him to sit.  Bowel/Bladder Dysfunction: none  Conservative measures:  Physical therapy:  participated in 2019, caused increased pain; he tried home health physical therapy in late 2023 and was discharged in December  Multimodal medical therapy including regular antiinflammatories:  meloxicam, gabapentin, tylenol, prednisone, ibuprofen, norco Injections:  has received epidural steroid injections 03/13/2021: Bilateral L4-5 transforaminal ESI (2 weeks of 90% relief and mild to moderate relief) 02/23/2019: Bilateral L4-5 transforaminal ESI (50% relief) 09/28/2018: Bilateral L4-5 transforaminal ESI (50% relief) 08/28/2018: Bilateral L4-5 transforaminal ESI (50% relief, best injection) 03/31/2018: Bilateral L3-4 and L4-5 facet joint injections (mild to moderate relief x1 day) 09/05/2017: Bilateral L5-S1 transforaminal ESI (minimal relief) 08/15/2017: Bilateral L4-5 transforaminal ESI (1 hour of increased discomfort immediately after the procedure, 4 days of good relief)   Past Surgery: none  Travis Santiago has no symptoms of cervical myelopathy.  The symptoms are causing a significant impact on the patient's life.   I have utilized the care  everywhere function in epic to review the outside records available from external health systems.  Review of Systems:  A 10 point review of systems is negative, except for the pertinent positives and negatives detailed in the HPI.  Past Medical History: Past Medical History:  Diagnosis Date   Arthritis    Cancer ([place]) 2007   colon    Hypertension     Past Surgical History: Past Surgical History:  Procedure Laterality Date   COLON SURGERY      Allergies: Allergies as of 05/07/2022 - Review Complete 05/07/2022  Allergen Reaction Noted   Other Anaphylaxis 07/16/2016    Medications: Current Meds  Medication Sig   acetaminophen (TYLENOL) 325 MG tablet Take 2 tablets (650 mg total) by mouth every 6 (six) hours as needed. Do not take more than 4062m of tylenol per day   acetaminophen (TYLENOL) 500 MG tablet Take 500 mg by mouth every 6 (six) hours as needed.   atorvastatin (LIPITOR) 40 MG tablet Take 1 tablet by mouth at bedtime.   chlorthalidone (HYGROTON) 25 MG tablet Take 1 tablet by mouth daily.   gabapentin (NEURONTIN) 300 MG capsule Take 300 mg by mouth 1 day or 1 dose.   ibuprofen (ADVIL,MOTRIN) 200 MG tablet Take 1 tablet (200 mg total) by mouth every 6 (six) hours as needed.   losartan (COZAAR) 100 MG tablet Take 1 tablet by mouth daily.   losartan (COZAAR) 25 MG tablet Take 1 tablet by mouth daily.   meloxicam (MOBIC) 15 MG tablet Take 15 mg by mouth daily.   naproxen sodium (ALEVE) 220 MG tablet Take by mouth.   sertraline (ZOLOFT) 25 MG tablet Take 25 mg by mouth daily.    Social  History: Social History   Tobacco Use   Smoking status: Former    Types: Cigarettes    Quit date: 07/30/1973    Years since quitting: 48.8   Smokeless tobacco: Never  Vaping Use   Vaping Use: Never used  Substance Use Topics   Alcohol use: Yes    Alcohol/week: 1.0 standard drink of alcohol    Types: 1 Glasses of wine per week    Comment: social about once a year   Drug use: Never     Family Medical History: Family History  Problem Relation Age of Onset   Gastric cancer Father    Prostate cancer Brother     Physical Examination: Vitals:   05/07/22 0846  BP: 128/74    General: Patient is well developed, well nourished, calm, collected, and in no apparent distress. Attention to examination is appropriate.  Neck:   Supple.  Full range of motion.  Respiratory: Patient is breathing without any difficulty.   NEUROLOGICAL:     Awake, alert, oriented to person, place, and time.  Speech is clear and fluent.   Cranial Nerves: Pupils equal round and reactive to light.  Facial tone is symmetric.  Facial sensation is symmetric. Shoulder shrug is symmetric. Tongue protrusion is midline.  There is no pronator drift.  ROM of spine: full.    Strength: Side Biceps Triceps Deltoid Interossei Grip Wrist Ext. Wrist Flex.  R 5 5 5 5 5 5 5  $ L 5 5 5 5 5 5 5   $ Side Iliopsoas Quads Hamstring PF DF EHL  R 5 5 5 5 5 5  $ L 5 5 5 5 5 5   $ Reflexes are 1+ and symmetric at the biceps, triceps, brachioradialis, patella and achilles.   Hoffman's is absent.   Bilateral upper and lower extremity sensation is intact to light touch.    No evidence of dysmetria noted.  Gait is slowed and slightly stooped.     Medical Decision Making  Imaging: MRI L spine 11/11/2021 IMPRESSION: 1. Lumbar spine degeneration most notably affecting the facets at L3-4 and below. Progression at L3-4 since 2019, with advanced spinal stenosis at this level. 2. L4-5 bilateral subarticular recess narrowing that could affect the L5 nerve roots, especially on the right. 3. Mild-to-moderate foraminal narrowings at L3-4 and L4-5.     Electronically Signed   By: Jorje Guild M.D.   On: 11/13/2021 11:11  I have personally reviewed the images and agree with the above interpretation.  Assessment and Plan: [name] is a pleasant 72 y.o. male with neurogenic claudication due to lumbar spinal  stenosis.  He has tried physical therapy and was discharged in December due to lack of improvement.  He is significantly limited by his current level of dysfunction.  At this point, I do not think that further conservative management is indicated.  I recommended surgical intervention with an L3-5 decompression to alleviate central compression at L3-4 and lateral recess compression at L4-5.  I discussed the planned procedure at length with the patient, including the risks, benefits, alternatives, and indications. The risks discussed include but are not limited to bleeding, infection, need for reoperation, spinal fluid leak, stroke, vision loss, anesthetic complication, coma, paralysis, and even death. I also described in detail that improvement was not guaranteed.  The patient expressed understanding of these risks, and asked that we proceed with surgery. I described the surgery in layman's terms, and gave ample opportunity for questions, which were answered to the best of  my ability.  I spent a total of 30 minutes in this patient's care today. This time was spent reviewing pertinent records including imaging studies, obtaining and confirming history, performing a directed evaluation, formulating and discussing my recommendations, and documenting the visit within the medical record.      Thank you for involving me in the care of this patient.      Shaelynn Dragos K. Izora Ribas MD, [facility] Neurosurgery

## 2022-05-07 ENCOUNTER — Ambulatory Visit: Payer: Medicare HMO | Admitting: Neurosurgery

## 2022-05-07 ENCOUNTER — Encounter: Payer: Self-pay | Admitting: Neurosurgery

## 2022-05-07 VITALS — BP 128/74 | Ht 66.0 in | Wt 192.6 lb

## 2022-05-07 DIAGNOSIS — M48062 Spinal stenosis, lumbar region with neurogenic claudication: Secondary | ICD-10-CM

## 2022-05-07 NOTE — Patient Instructions (Signed)
Please see below for information in regards to your upcoming surgery:  Planned surgery: L3-5 decompression   Surgery date: 06/19/22 - you will find out your arrival time the business day before your surgery.   Pre-op appointment at [facility]: we will call you with a date/time for this. Pre-admit testing is located on the first floor of the [facility], [place], [address]. Please bring all prescriptions in the original prescription bottles to your appointment, even if you have reviewed medications by phone with a pharmacy representative. Pre-op labs may be done at your pre-op appointment. You are not required to fast for these labs. Should you need to change your pre-op appointment, please call Pre-admit testing at [phone].    Surgical clearance: we will send a clearance request to Dr [name]    Home health physical therapy: [facility] [place] will contact you regarding home health physical therapy for after surgery.Their number is [phone].    If you have FMLA/disability paperwork, please drop it off or fax it to [phone], attention [place].   We can be reached by phone or mychart 8am-4pm, Monday-Friday. If you have any questions/concerns before or after surgery, you can reach [place] at [phone], or you can send a mychart message. If you have a concern after hours that cannot wait until normal business hours, you can call [phone] and ask to page the neurosurgeon on call for [place].     Appointments/FMLA & disability paperwork: [place]  Nurse: Ophelia Shoulder  Medical assistants: Lum Keas Physician Assistant's: [place] Surgeon: Meade Maw, MD

## 2022-05-08 DIAGNOSIS — H52223 Regular astigmatism, bilateral: Secondary | ICD-10-CM | POA: Diagnosis not present

## 2022-05-08 DIAGNOSIS — H5203 Hypermetropia, bilateral: Secondary | ICD-10-CM | POA: Diagnosis not present

## 2022-05-08 DIAGNOSIS — H524 Presbyopia: Secondary | ICD-10-CM | POA: Diagnosis not present

## 2022-05-08 DIAGNOSIS — H04123 Dry eye syndrome of bilateral lacrimal glands: Secondary | ICD-10-CM | POA: Diagnosis not present

## 2022-05-08 DIAGNOSIS — H259 Unspecified age-related cataract: Secondary | ICD-10-CM | POA: Diagnosis not present

## 2022-05-13 ENCOUNTER — Other Ambulatory Visit: Payer: Self-pay

## 2022-05-13 DIAGNOSIS — Z01818 Encounter for other preprocedural examination: Secondary | ICD-10-CM

## 2022-05-21 DIAGNOSIS — G8929 Other chronic pain: Secondary | ICD-10-CM | POA: Diagnosis not present

## 2022-05-21 DIAGNOSIS — R42 Dizziness and giddiness: Secondary | ICD-10-CM | POA: Diagnosis not present

## 2022-05-21 DIAGNOSIS — E7800 Pure hypercholesterolemia, unspecified: Secondary | ICD-10-CM | POA: Diagnosis not present

## 2022-05-21 DIAGNOSIS — R7303 Prediabetes: Secondary | ICD-10-CM | POA: Diagnosis not present

## 2022-05-21 DIAGNOSIS — I10 Essential (primary) hypertension: Secondary | ICD-10-CM | POA: Diagnosis not present

## 2022-05-21 DIAGNOSIS — M545 Low back pain, unspecified: Secondary | ICD-10-CM | POA: Diagnosis not present

## 2022-05-21 DIAGNOSIS — Z01810 Encounter for preprocedural cardiovascular examination: Secondary | ICD-10-CM | POA: Diagnosis not present

## 2022-05-21 DIAGNOSIS — Z6833 Body mass index (BMI) 33.0-33.9, adult: Secondary | ICD-10-CM | POA: Diagnosis not present

## 2022-05-21 DIAGNOSIS — E6609 Other obesity due to excess calories: Secondary | ICD-10-CM | POA: Diagnosis not present

## 2022-06-04 DIAGNOSIS — R498 Other voice and resonance disorders: Secondary | ICD-10-CM | POA: Diagnosis not present

## 2022-06-04 DIAGNOSIS — R29898 Other symptoms and signs involving the musculoskeletal system: Secondary | ICD-10-CM | POA: Diagnosis not present

## 2022-06-04 DIAGNOSIS — F411 Generalized anxiety disorder: Secondary | ICD-10-CM | POA: Diagnosis not present

## 2022-06-04 DIAGNOSIS — G8929 Other chronic pain: Secondary | ICD-10-CM | POA: Diagnosis not present

## 2022-06-04 DIAGNOSIS — M545 Low back pain, unspecified: Secondary | ICD-10-CM | POA: Diagnosis not present

## 2022-06-04 DIAGNOSIS — R2689 Other abnormalities of gait and mobility: Secondary | ICD-10-CM | POA: Diagnosis not present

## 2022-06-04 DIAGNOSIS — R262 Difficulty in walking, not elsewhere classified: Secondary | ICD-10-CM | POA: Diagnosis not present

## 2022-06-06 ENCOUNTER — Encounter
Admission: RE | Admit: 2022-06-06 | Discharge: 2022-06-06 | Disposition: A | Discharge status: Home / Self Care | Payer: Medicare HMO | Source: Ambulatory Visit | Attending: Neurosurgery | Admitting: Neurosurgery

## 2022-06-06 DIAGNOSIS — Z01812 Encounter for preprocedural laboratory examination: Secondary | ICD-10-CM | POA: Insufficient documentation

## 2022-06-06 DIAGNOSIS — I10 Essential (primary) hypertension: Secondary | ICD-10-CM | POA: Diagnosis not present

## 2022-06-06 HISTORY — DX: Gastro-esophageal reflux disease without esophagitis: K21.9

## 2022-06-06 HISTORY — DX: Vitamin D deficiency, unspecified: E55.9

## 2022-06-06 HISTORY — DX: Hyperlipidemia, unspecified: E78.5

## 2022-06-06 HISTORY — DX: Prediabetes: R73.03

## 2022-06-06 LAB — TYPE AND SCREEN
ABO/RH(D): A POS
Antibody Screen: NEGATIVE

## 2022-06-06 LAB — CBC
HCT: 42.6 % (ref 39.0–52.0)
Hemoglobin: 13.9 g/dL (ref 13.0–17.0)
MCH: 28.9 pg (ref 26.0–34.0)
MCHC: 32.6 g/dL (ref 30.0–36.0)
MCV: 88.6 fL (ref 80.0–100.0)
Platelets: 214 10*3/uL (ref 150–400)
RBC: 4.81 MIL/uL (ref 4.22–5.81)
RDW: 12.8 % (ref 11.5–15.5)
WBC: 4.5 10*3/uL (ref 4.0–10.5)
nRBC: 0 % (ref 0.0–0.2)

## 2022-06-06 LAB — BASIC METABOLIC PANEL
Anion gap: 10 (ref 5–15)
BUN: 22 mg/dL (ref 8–23)
CO2: 25 mmol/L (ref 22–32)
Calcium: 8.9 mg/dL (ref 8.9–10.3)
Chloride: 104 mmol/L (ref 98–111)
Creatinine, Ser: 1.42 mg/dL — ABNORMAL HIGH (ref 0.61–1.24)
GFR, Estimated: 53 mL/min — ABNORMAL LOW (ref >60)
Glucose, Bld: 96 mg/dL (ref 70–99)
Potassium: 3.5 mmol/L (ref 3.5–5.1)
Sodium: 139 mmol/L (ref 135–145)

## 2022-06-06 LAB — SURGICAL PCR SCREEN
MRSA, PCR: NEGATIVE
Staphylococcus aureus: NEGATIVE

## 2022-06-06 LAB — URINALYSIS, ROUTINE W REFLEX MICROSCOPIC
Bilirubin Urine: NEGATIVE
Glucose, UA: NEGATIVE mg/dL
Hgb urine dipstick: NEGATIVE
Ketones, ur: NEGATIVE mg/dL
Leukocytes,Ua: NEGATIVE
Nitrite: NEGATIVE
Protein, ur: NEGATIVE mg/dL
Specific Gravity, Urine: 1.026 (ref 1.005–1.030)
pH: 5 (ref 5.0–8.0)

## 2022-06-06 NOTE — Patient Instructions (Addendum)
Your procedure is scheduled on: Wednesday, March 27 Report to the Registration Desk on the 1st floor of the [facility]. To find out your arrival time, please call [phone] between 1PM - 3PM on: Tuesday, March 26 If your arrival time is 6:00 am, do not arrive before that time as the [facility] entrance doors do not open until 6:00 am.  REMEMBER: Instructions that are not followed completely may result in serious medical risk, up to and including death; or upon the discretion of your surgeon and anesthesiologist your surgery may need to be rescheduled.  Do not eat food after midnight the night before surgery.  No gum chewing or hard candies.  You may however, drink CLEAR liquids up to 2 hours before you are scheduled to arrive for your surgery. Do not drink anything within 2 hours of your scheduled arrival time.  Clear liquids include: - water  - apple juice without pulp - gatorade (not RED colors) - black coffee or tea (Do NOT add milk or creamers to the coffee or tea) Do NOT drink anything that is not on this list.  One week prior to surgery: starting March 20 Stop Anti-inflammatories (NSAIDS) such as Advil, Aleve, Ibuprofen, Motrin, Naproxen, Naprosyn and Aspirin based products such as Excedrin, Goody's Powder, BC Powder. Stop ANY OVER THE COUNTER supplements until after surgery. You may however, continue to take Tylenol if needed for pain up until the day of surgery.  Continue taking all prescribed medications with the exception of the following:  Meloxicam - stop taking March 20. Resume AFTER surgery per surgeon instruction.  DO NOT TAKE ANY MEDICATIONS THE MORNING OF SURGERY   No Alcohol for 24 hours before or after surgery.  No Smoking including e-cigarettes for 24 hours before surgery.  No chewable tobacco products for at least 6 hours before surgery.  No nicotine patches on the day of surgery.  Do not use any "recreational" drugs for at least a week (preferably 2  weeks) before your surgery.  Please be advised that the combination of cocaine and anesthesia may have negative outcomes, up to and including death. If you test positive for cocaine, your surgery will be cancelled.  On the morning of surgery brush your teeth with toothpaste and water, you may rinse your mouth with mouthwash if you wish. Do not swallow any toothpaste or mouthwash.  Use CHG Soap as directed on instruction sheet.  Do not wear jewelry, make-up, hairpins, clips or nail polish.  Do not wear lotions, powders, or perfumes.   Do not shave body hair from the neck down 48 hours before surgery.  Contact lenses, hearing aids and dentures may not be worn into surgery.  Do not bring valuables to the hospital. [facility] is not responsible for any missing/lost belongings or valuables.   Notify your doctor if there is any change in your medical condition (cold, fever, infection).  Wear comfortable clothing (specific to your surgery type) to the hospital.  After surgery, you can help prevent lung complications by doing breathing exercises.  Take deep breaths and cough every 1-2 hours. Your doctor may order a device called an Incentive Spirometer to help you take deep breaths.  If you are being discharged the day of surgery, you will not be allowed to drive home. You will need a responsible individual to drive you home and stay with you for 24 hours after surgery.   If you are taking public transportation, you will need to have a responsible  individual with you.  Please call the [facility] at [phone] if you have any questions about these instructions.  Surgery Visitation Policy:  Patients undergoing a surgery or procedure may have two family members or support persons with them as long as the person is not COVID-19 positive or experiencing its symptoms.      Preparing for Surgery with CHLORHEXIDINE GLUCONATE (CHG) Soap  Chlorhexidine Gluconate (CHG)  Soap  o An antiseptic cleaner that kills germs and bonds with the skin to continue killing germs even after washing  o Used for showering the night before surgery and morning of surgery  Before surgery, you can play an important role by reducing the number of germs on your skin.  CHG (Chlorhexidine gluconate) soap is an antiseptic cleanser which kills germs and bonds with the skin to continue killing germs even after washing.  Please do not use if you have an allergy to CHG or antibacterial soaps. If your skin becomes reddened/irritated stop using the CHG.  1. Shower the NIGHT BEFORE SURGERY and the MORNING OF SURGERY with CHG soap.  2. If you choose to wash your hair, wash your hair first as usual with your normal shampoo.  3. After shampooing, rinse your hair and body thoroughly to remove the shampoo.  4. Use CHG as you would any other liquid soap. You can apply CHG directly to the skin and wash gently with a scrungie or a clean washcloth.  5. Apply the CHG soap to your body only from the neck down. Do not use on open wounds or open sores. Avoid contact with your eyes, ears, mouth, and genitals (private parts). Wash face and genitals (private parts) with your normal soap.  6. Wash thoroughly, paying special attention to the area where your surgery will be performed.  7. Thoroughly rinse your body with warm water.  8. Do not shower/wash with your normal soap after using and rinsing off the CHG soap.  9. Pat yourself dry with a clean towel.  10. Wear clean pajamas to bed the night before surgery.  12. Place clean sheets on your bed the night of your first shower and do not sleep with pets.  13. Shower again with the CHG soap on the day of surgery prior to arriving at the hospital.  14. Do not apply any deodorants/lotions/powders.  15. Please wear clean clothes to the hospital.

## 2022-06-12 ENCOUNTER — Encounter: Payer: Self-pay | Admitting: Neurosurgery

## 2022-06-14 DIAGNOSIS — R42 Dizziness and giddiness: Secondary | ICD-10-CM | POA: Diagnosis not present

## 2022-06-14 DIAGNOSIS — Z01810 Encounter for preprocedural cardiovascular examination: Secondary | ICD-10-CM | POA: Diagnosis not present

## 2022-06-17 ENCOUNTER — Encounter: Payer: Self-pay | Admitting: Neurosurgery

## 2022-06-18 MED ORDER — ORAL CARE MOUTH RINSE: 15.0000 mL | Freq: Once | OROMUCOSAL | Status: AC

## 2022-06-18 MED ORDER — FAMOTIDINE 20 MG PO TABS: 20.0000 mg | ORAL_TABLET | Freq: Once | ORAL | Status: AC

## 2022-06-18 MED ORDER — CEFAZOLIN SODIUM-DEXTROSE 2-4 GM/100ML-% IV SOLN
2.0000 g | INTRAVENOUS | Status: AC
  Administered 2022-06-19: 2 g via INTRAVENOUS

## 2022-06-18 MED ORDER — CHLORHEXIDINE GLUCONATE 0.12 % MT SOLN: 15.0000 mL | Freq: Once | OROMUCOSAL | Status: AC

## 2022-06-18 MED ORDER — CEFAZOLIN IN SODIUM CHLORIDE 2-0.9 GM/100ML-% IV SOLN
2.0000 g | Freq: Once | INTRAVENOUS | Status: DC
  Filled 2022-06-18: qty 100

## 2022-06-18 MED ORDER — LACTATED RINGERS IV SOLN: INTRAVENOUS | Status: DC

## 2022-06-19 ENCOUNTER — Ambulatory Visit: Payer: Medicare HMO | Admitting: Urgent Care

## 2022-06-19 ENCOUNTER — Encounter
Admission: RE | Disposition: A | Discharge status: Home / Self Care | Payer: Self-pay | Source: Ambulatory Visit | Attending: Neurosurgery

## 2022-06-19 ENCOUNTER — Ambulatory Visit: Payer: Medicare HMO

## 2022-06-19 ENCOUNTER — Other Ambulatory Visit: Payer: Self-pay

## 2022-06-19 ENCOUNTER — Encounter: Payer: Self-pay | Admitting: Neurosurgery

## 2022-06-19 ENCOUNTER — Ambulatory Visit
Admission: RE | Admit: 2022-06-19 | Discharge: 2022-06-19 | Disposition: A | Discharge status: Home / Self Care | Payer: Medicare HMO | Source: Ambulatory Visit | Attending: Neurosurgery | Admitting: Neurosurgery

## 2022-06-19 DIAGNOSIS — Z79899 Other long term (current) drug therapy: Secondary | ICD-10-CM | POA: Diagnosis not present

## 2022-06-19 DIAGNOSIS — Z9221 Personal history of antineoplastic chemotherapy: Secondary | ICD-10-CM | POA: Insufficient documentation

## 2022-06-19 DIAGNOSIS — E785 Hyperlipidemia, unspecified: Secondary | ICD-10-CM | POA: Diagnosis not present

## 2022-06-19 DIAGNOSIS — Z85038 Personal history of other malignant neoplasm of large intestine: Secondary | ICD-10-CM | POA: Insufficient documentation

## 2022-06-19 DIAGNOSIS — K219 Gastro-esophageal reflux disease without esophagitis: Secondary | ICD-10-CM | POA: Insufficient documentation

## 2022-06-19 DIAGNOSIS — I10 Essential (primary) hypertension: Secondary | ICD-10-CM | POA: Insufficient documentation

## 2022-06-19 DIAGNOSIS — Z87891 Personal history of nicotine dependence: Secondary | ICD-10-CM | POA: Insufficient documentation

## 2022-06-19 DIAGNOSIS — G8929 Other chronic pain: Secondary | ICD-10-CM | POA: Diagnosis not present

## 2022-06-19 DIAGNOSIS — S32000A Wedge compression fracture of unspecified lumbar vertebra, initial encounter for closed fracture: Secondary | ICD-10-CM | POA: Diagnosis not present

## 2022-06-19 DIAGNOSIS — Z01818 Encounter for other preprocedural examination: Secondary | ICD-10-CM

## 2022-06-19 DIAGNOSIS — Z791 Long term (current) use of non-steroidal anti-inflammatories (NSAID): Secondary | ICD-10-CM | POA: Diagnosis not present

## 2022-06-19 DIAGNOSIS — M1990 Unspecified osteoarthritis, unspecified site: Secondary | ICD-10-CM | POA: Insufficient documentation

## 2022-06-19 DIAGNOSIS — Z01812 Encounter for preprocedural laboratory examination: Secondary | ICD-10-CM

## 2022-06-19 DIAGNOSIS — F419 Anxiety disorder, unspecified: Secondary | ICD-10-CM | POA: Insufficient documentation

## 2022-06-19 DIAGNOSIS — R7303 Prediabetes: Secondary | ICD-10-CM | POA: Insufficient documentation

## 2022-06-19 DIAGNOSIS — M48062 Spinal stenosis, lumbar region with neurogenic claudication: Secondary | ICD-10-CM

## 2022-06-19 DIAGNOSIS — F32A Depression, unspecified: Secondary | ICD-10-CM | POA: Diagnosis not present

## 2022-06-19 DIAGNOSIS — I38 Endocarditis, valve unspecified: Secondary | ICD-10-CM | POA: Diagnosis not present

## 2022-06-19 HISTORY — DX: Anxiety disorder, unspecified: F41.9

## 2022-06-19 HISTORY — DX: Other ill-defined heart diseases: I51.89

## 2022-06-19 HISTORY — DX: Depression, unspecified: F32.A

## 2022-06-19 HISTORY — DX: Spinal stenosis, lumbar region without neurogenic claudication: M48.061

## 2022-06-19 HISTORY — DX: Low back pain, unspecified: M54.50

## 2022-06-19 HISTORY — PX: LUMBAR LAMINECTOMY/DECOMPRESSION MICRODISCECTOMY: SHX5026

## 2022-06-19 HISTORY — DX: Other chronic pain: G89.29

## 2022-06-19 HISTORY — DX: Spinal stenosis, lumbar region with neurogenic claudication: M48.062

## 2022-06-19 LAB — ABO/RH: ABO/RH(D): A POS

## 2022-06-19 SURGERY — LUMBAR LAMINECTOMY/DECOMPRESSION MICRODISCECTOMY 2 LEVELS
Anesthesia: General | Site: Spine Lumbar

## 2022-06-19 MED ORDER — GLYCOPYRROLATE 0.2 MG/ML IJ SOLN
INTRAMUSCULAR | Status: DC | PRN
  Administered 2022-06-19: .2 mg via INTRAVENOUS

## 2022-06-19 MED ORDER — METHOCARBAMOL 500 MG PO TABS: 500.0000 mg | ORAL_TABLET | Freq: Four times a day (QID) | ORAL | 0 refills | Status: DC

## 2022-06-19 MED ORDER — FENTANYL CITRATE (PF) 100 MCG/2ML IJ SOLN
INTRAMUSCULAR | Status: AC
  Filled 2022-06-19: qty 2

## 2022-06-19 MED ORDER — GLYCOPYRROLATE 0.2 MG/ML IJ SOLN
INTRAMUSCULAR | Status: AC
  Filled 2022-06-19: qty 1

## 2022-06-19 MED ORDER — EPINEPHRINE PF 1 MG/ML IJ SOLN
INTRAMUSCULAR | Status: AC
  Filled 2022-06-19: qty 1

## 2022-06-19 MED ORDER — SODIUM CHLORIDE (PF) 0.9 % IJ SOLN
INTRAMUSCULAR | Status: DC | PRN
  Administered 2022-06-19: 60 mL via INTRAMUSCULAR

## 2022-06-19 MED ORDER — ACETAMINOPHEN 500 MG PO TABS
ORAL_TABLET | ORAL | Status: AC
  Filled 2022-06-19: qty 2

## 2022-06-19 MED ORDER — STERILE WATER FOR IRRIGATION IR SOLN
Status: DC | PRN
  Administered 2022-06-19: 500 mL

## 2022-06-19 MED ORDER — BUPIVACAINE LIPOSOME 1.3 % IJ SUSP
INTRAMUSCULAR | Status: AC
  Filled 2022-06-19: qty 20

## 2022-06-19 MED ORDER — PHENYLEPHRINE 80 MCG/ML (10ML) SYRINGE FOR IV PUSH (FOR BLOOD PRESSURE SUPPORT)
PREFILLED_SYRINGE | INTRAVENOUS | Status: AC
  Filled 2022-06-19: qty 10

## 2022-06-19 MED ORDER — LIDOCAINE HCL (PF) 2 % IJ SOLN
INTRAMUSCULAR | Status: AC
  Filled 2022-06-19: qty 5

## 2022-06-19 MED ORDER — OXYCODONE HCL 5 MG PO TABS: 5.0000 mg | ORAL_TABLET | Freq: Once | ORAL | Status: DC | PRN

## 2022-06-19 MED ORDER — METHYLPREDNISOLONE ACETATE 40 MG/ML IJ SUSP
INTRAMUSCULAR | Status: AC
  Filled 2022-06-19: qty 1

## 2022-06-19 MED ORDER — LIDOCAINE HCL (CARDIAC) PF 100 MG/5ML IV SOSY
PREFILLED_SYRINGE | INTRAVENOUS | Status: DC | PRN
  Administered 2022-06-19: 100 mg via INTRAVENOUS

## 2022-06-19 MED ORDER — OXYCODONE HCL 5 MG PO TABS
5.0000 mg | ORAL_TABLET | Freq: Once | ORAL | Status: AC
  Administered 2022-06-19: 5 mg via ORAL

## 2022-06-19 MED ORDER — SUCCINYLCHOLINE CHLORIDE 200 MG/10ML IV SOSY
PREFILLED_SYRINGE | INTRAVENOUS | Status: AC
  Filled 2022-06-19: qty 10

## 2022-06-19 MED ORDER — SURGIFLO WITH THROMBIN (HEMOSTATIC MATRIX KIT) OPTIME
TOPICAL | Status: DC | PRN
  Administered 2022-06-19: 1 via TOPICAL

## 2022-06-19 MED ORDER — DEXAMETHASONE SODIUM PHOSPHATE 10 MG/ML IJ SOLN
INTRAMUSCULAR | Status: AC
  Filled 2022-06-19: qty 1

## 2022-06-19 MED ORDER — CHLORHEXIDINE GLUCONATE 0.12 % MT SOLN
OROMUCOSAL | Status: AC
  Administered 2022-06-19: 15 mL via OROMUCOSAL
  Filled 2022-06-19: qty 15

## 2022-06-19 MED ORDER — PHENYLEPHRINE HCL-NACL 20-0.9 MG/250ML-% IV SOLN
INTRAVENOUS | Status: DC | PRN
  Administered 2022-06-19: 24 ug/min via INTRAVENOUS

## 2022-06-19 MED ORDER — PROPOFOL 10 MG/ML IV BOLUS
INTRAVENOUS | Status: DC | PRN
  Administered 2022-06-19: 50 mg via INTRAVENOUS
  Administered 2022-06-19: 120 mg via INTRAVENOUS

## 2022-06-19 MED ORDER — SUCCINYLCHOLINE CHLORIDE 200 MG/10ML IV SOSY
PREFILLED_SYRINGE | INTRAVENOUS | Status: DC | PRN
  Administered 2022-06-19: 100 mg via INTRAVENOUS

## 2022-06-19 MED ORDER — OXYCODONE HCL 5 MG PO CAPS: 5.0000 mg | ORAL_CAPSULE | ORAL | 0 refills | Status: AC | PRN

## 2022-06-19 MED ORDER — FENTANYL CITRATE (PF) 100 MCG/2ML IJ SOLN: 25.0000 ug | INTRAMUSCULAR | Status: DC | PRN

## 2022-06-19 MED ORDER — ACETAMINOPHEN 500 MG PO TABS
1000.0000 mg | ORAL_TABLET | Freq: Once | ORAL | Status: AC
  Administered 2022-06-19: 1000 mg via ORAL

## 2022-06-19 MED ORDER — ONDANSETRON HCL 4 MG/2ML IJ SOLN
INTRAMUSCULAR | Status: DC | PRN
  Administered 2022-06-19: 4 mg via INTRAVENOUS

## 2022-06-19 MED ORDER — BUPIVACAINE-EPINEPHRINE (PF) 0.5% -1:200000 IJ SOLN
INTRAMUSCULAR | Status: DC | PRN
  Administered 2022-06-19: 1 mL via PERINEURAL

## 2022-06-19 MED ORDER — PHENYLEPHRINE HCL (PRESSORS) 10 MG/ML IV SOLN
INTRAVENOUS | Status: DC | PRN
  Administered 2022-06-19 (×3): 80 ug via INTRAVENOUS
  Administered 2022-06-19: 160 ug via INTRAVENOUS
  Administered 2022-06-19: 80 ug via INTRAVENOUS
  Administered 2022-06-19 (×2): 160 ug via INTRAVENOUS

## 2022-06-19 MED ORDER — DEXAMETHASONE SODIUM PHOSPHATE 10 MG/ML IJ SOLN
INTRAMUSCULAR | Status: DC | PRN
  Administered 2022-06-19: 10 mg via INTRAVENOUS

## 2022-06-19 MED ORDER — ONDANSETRON HCL 4 MG/2ML IJ SOLN: 4.0000 mg | Freq: Once | INTRAMUSCULAR | Status: DC | PRN

## 2022-06-19 MED ORDER — METHYLPREDNISOLONE ACETATE 40 MG/ML IJ SUSP
INTRAMUSCULAR | Status: DC | PRN
  Administered 2022-06-19: 40 mg

## 2022-06-19 MED ORDER — CEFAZOLIN SODIUM-DEXTROSE 2-4 GM/100ML-% IV SOLN
INTRAVENOUS | Status: AC
  Filled 2022-06-19: qty 100

## 2022-06-19 MED ORDER — OXYCODONE HCL 5 MG/5ML PO SOLN: 5.0000 mg | Freq: Once | ORAL | Status: DC | PRN

## 2022-06-19 MED ORDER — OXYCODONE HCL 5 MG PO TABS
ORAL_TABLET | ORAL | Status: AC
  Filled 2022-06-19: qty 1

## 2022-06-19 MED ORDER — BUPIVACAINE HCL (PF) 0.5 % IJ SOLN
INTRAMUSCULAR | Status: AC
  Filled 2022-06-19: qty 60

## 2022-06-19 MED ORDER — FAMOTIDINE 20 MG PO TABS
ORAL_TABLET | ORAL | Status: AC
  Administered 2022-06-19: 20 mg via ORAL
  Filled 2022-06-19: qty 1

## 2022-06-19 MED ORDER — FENTANYL CITRATE (PF) 100 MCG/2ML IJ SOLN
INTRAMUSCULAR | Status: DC | PRN
  Administered 2022-06-19 (×2): 50 ug via INTRAVENOUS

## 2022-06-19 MED ORDER — SODIUM CHLORIDE FLUSH 0.9 % IV SOLN
INTRAVENOUS | Status: AC
  Filled 2022-06-19: qty 20

## 2022-06-19 MED ORDER — 0.9 % SODIUM CHLORIDE (POUR BTL) OPTIME
TOPICAL | Status: DC | PRN
  Administered 2022-06-19: 500 mL

## 2022-06-19 MED ORDER — ONDANSETRON HCL 4 MG/2ML IJ SOLN
INTRAMUSCULAR | Status: AC
  Filled 2022-06-19: qty 2

## 2022-06-19 SURGICAL SUPPLY — 40 items
ADH SKN CLS APL DERMABOND .7 (GAUZE/BANDAGES/DRESSINGS) ×1
AGENT HMST KT MTR STRL THRMB (HEMOSTASIS) ×1
BASIN KIT SINGLE STR (MISCELLANEOUS) ×1 IMPLANT
BUR NEURO DRILL SOFT 3.0X3.8M (BURR) ×1 IMPLANT
CNTNR URN SCR LID CUP LEK RST (MISCELLANEOUS) ×1 IMPLANT
CONT SPEC 4OZ STRL OR WHT (MISCELLANEOUS) ×1
DERMABOND ADVANCED .7 DNX12 (GAUZE/BANDAGES/DRESSINGS) ×1 IMPLANT
DRAPE C ARM PK CFD 31 SPINE (DRAPES) ×1 IMPLANT
DRAPE LAPAROTOMY 100X77 ABD (DRAPES) ×1 IMPLANT
DRAPE MICROSCOPE SPINE 48X150 (DRAPES) ×1 IMPLANT
DRSG OPSITE POSTOP 3X4 (GAUZE/BANDAGES/DRESSINGS) IMPLANT
ELECT EZSTD 165MM 6.5IN (MISCELLANEOUS) ×1
ELECT REM PT RETURN 9FT ADLT (ELECTROSURGICAL) ×1
ELECTRODE EZSTD 165MM 6.5IN (MISCELLANEOUS) ×1 IMPLANT
ELECTRODE REM PT RTRN 9FT ADLT (ELECTROSURGICAL) ×1 IMPLANT
GLOVE BIOGEL PI IND STRL 6.5 (GLOVE) ×1 IMPLANT
GLOVE SURG SYN 6.5 ES PF (GLOVE) ×1 IMPLANT
GLOVE SURG SYN 6.5 PF PI (GLOVE) ×1 IMPLANT
GLOVE SURG SYN 8.5  E (GLOVE) ×3
GLOVE SURG SYN 8.5 E (GLOVE) ×3 IMPLANT
GLOVE SURG SYN 8.5 PF PI (GLOVE) ×3 IMPLANT
GOWN SRG LRG LVL 4 IMPRV REINF (GOWNS) ×1 IMPLANT
GOWN SRG XL LVL 3 NONREINFORCE (GOWNS) ×1 IMPLANT
GOWN STRL NON-REIN TWL XL LVL3 (GOWNS) ×1
GOWN STRL REIN LRG LVL4 (GOWNS) ×1
KIT SPINAL PRONEVIEW (KITS) ×1 IMPLANT
MANIFOLD NEPTUNE II (INSTRUMENTS) ×1 IMPLANT
MARKER SKIN DUAL TIP RULER LAB (MISCELLANEOUS) ×1 IMPLANT
NDL SAFETY ECLIP 18X1.5 (MISCELLANEOUS) ×1 IMPLANT
NS IRRIG 1000ML POUR BTL (IV SOLUTION) ×1 IMPLANT
PACK LAMINECTOMY NEURO (CUSTOM PROCEDURE TRAY) ×1 IMPLANT
SURGIFLO W/THROMBIN 8M KIT (HEMOSTASIS) ×1 IMPLANT
SUT DVC VLOC 3-0 CL 6 P-12 (SUTURE) ×1 IMPLANT
SUT VIC AB 0 CT1 27 (SUTURE) ×1
SUT VIC AB 0 CT1 27XCR 8 STRN (SUTURE) ×1 IMPLANT
SUT VIC AB 2-0 CT1 18 (SUTURE) ×1 IMPLANT
SYR 30ML LL (SYRINGE) ×2 IMPLANT
SYR 3ML LL SCALE MARK (SYRINGE) ×1 IMPLANT
TRAP FLUID SMOKE EVACUATOR (MISCELLANEOUS) ×1 IMPLANT
WATER STERILE IRR 1000ML POUR (IV SOLUTION) ×2 IMPLANT

## 2022-06-19 NOTE — Anesthesia Postprocedure Evaluation (Signed)
Anesthesia Post Note  Patient: Travis Santiago  Procedure(s) Performed: L3-5 DECOMPRESSION (Spine Lumbar)  Patient location during evaluation: PACU Anesthesia Type: General Level of consciousness: awake and alert Pain management: pain level controlled Vital Signs Assessment: post-procedure vital signs reviewed and stable Respiratory status: spontaneous breathing, nonlabored ventilation, respiratory function stable and patient connected to nasal cannula oxygen Cardiovascular status: blood pressure returned to baseline and stable Postop Assessment: no apparent nausea or vomiting Anesthetic complications: no   No notable events documented.   Last Vitals:  Vitals:   06/19/22 1245 06/19/22 1307  BP:  (!) 134/94  Pulse:  90  Resp:  16  Temp: (!) 36.1 C (!) 36.3 C  SpO2:  92%    Last Pain:  Vitals:   06/19/22 1307  TempSrc: Temporal  PainSc: 2                  Arita Miss

## 2022-06-19 NOTE — H&P (Signed)
Referring Physician:  No referring provider defined for this encounter.  Primary Physician:  [name], MD  History of Present Illness: 06/19/2022 Travis Santiago presents today for surgical intervention.  05/07/2022 Travis Santiago is here today with a chief complaint  bilateral low back pain with radiation to bilateral buttock, posterior thighs and calves and hip pain.  He has been having progressive pain for the last 5 years.  He reports sharp and aching pain sometimes tingling and numbness.  He has decreased sensation in his legs.  Standing and walking make his symptoms worse prickly pain in his buttocks and the back of his legs.  He feels a little bit off balance due to his discomfort.  He can only stand in 1 position for approximately 5 minutes until he has pain that causes him to sit.  Bowel/Bladder Dysfunction: none  Conservative measures:  Physical therapy:  participated in 2019, caused increased pain; he tried home health physical therapy in late 2023 and was discharged in December  Multimodal medical therapy including regular antiinflammatories:  meloxicam, gabapentin, tylenol, prednisone, ibuprofen, norco Injections:  has received epidural steroid injections 03/13/2021: Bilateral L4-5 transforaminal ESI (2 weeks of 90% relief and mild to moderate relief) 02/23/2019: Bilateral L4-5 transforaminal ESI (50% relief) 09/28/2018: Bilateral L4-5 transforaminal ESI (50% relief) 08/28/2018: Bilateral L4-5 transforaminal ESI (50% relief, best injection) 03/31/2018: Bilateral L3-4 and L4-5 facet joint injections (mild to moderate relief x1 day) 09/05/2017: Bilateral L5-S1 transforaminal ESI (minimal relief) 08/15/2017: Bilateral L4-5 transforaminal ESI (1 hour of increased discomfort immediately after the procedure, 4 days of good relief)   Past Surgery: none  Travis Santiago has no symptoms of cervical myelopathy.  The symptoms are causing a significant impact on  the patient's life.   I have utilized the care everywhere function in epic to review the outside records available from external health systems.  Review of Systems:  A 10 point review of systems is negative, except for the pertinent positives and negatives detailed in the HPI.  Past Medical History: Past Medical History:  Diagnosis Date   Anxiety    Arthritis    Chronic lower back pain    Colon cancer ([place]) 01/2005   a.) s/p resection + adjuvant chemotherapy   Depression    Diastolic dysfunction    a.) TTE 06/14/2022: EF >55%, mild LVH, triv TR, mild MR/PR, G1DD   GERD (gastroesophageal reflux disease)    Hyperlipidemia    Hypertension    Pre-diabetes    Spinal stenosis of lumbar region with neurogenic claudication    Vitamin D deficiency     Past Surgical History: Past Surgical History:  Procedure Laterality Date   COLECTOMY  01/2005   COLONOSCOPY  2006   COLONOSCOPY  10/01/2016   PORT-A-CATH REMOVAL  08/2005   PORTACATH PLACEMENT  03/2005    Allergies: Allergies as of 05/13/2022 - Review Complete 05/07/2022  Allergen Reaction Noted   Other Anaphylaxis 07/16/2016    Medications: Current Meds  Medication Sig   acetaminophen (TYLENOL) 500 MG tablet Take 500 mg by mouth every 6 (six) hours as needed.   atorvastatin (LIPITOR) 40 MG tablet Take 1 tablet by mouth at bedtime.   chlorthalidone (HYGROTON) 25 MG tablet Take 1 tablet by mouth at bedtime.   gabapentin (NEURONTIN) 300 MG capsule Take 300 mg by mouth at bedtime.   losartan (COZAAR) 100 MG tablet Take 1 tablet by mouth at bedtime.   meloxicam (MOBIC) 15 MG tablet Take 15 mg by  mouth daily.   sertraline (ZOLOFT) 25 MG tablet Take 25 mg by mouth at bedtime.    Social History: Social History   Tobacco Use   Smoking status: Former    Types: Cigarettes    Quit date: 07/30/1973    Years since quitting: 48.9   Smokeless tobacco: Never  Vaping Use   Vaping Use: Never used  Substance Use Topics   Alcohol use:  Yes    Alcohol/week: 1.0 standard drink of alcohol    Types: 1 Glasses of wine per week    Comment: rarely   Drug use: Never    Family Medical History: Family History  Problem Relation Age of Onset   Gastric cancer Father    Prostate cancer Brother     Physical Examination: Vitals:   06/19/22 0814  BP: 102/80  Pulse: 88  Resp: 18  Temp: (!) 97.3 F (36.3 C)  SpO2: 97%   Heart sounds normal no MRG. Chest Clear to Auscultation Bilaterally.  General: Patient is well developed, well nourished, calm, collected, and in no apparent distress. Attention to examination is appropriate.  Neck:   Supple.  Full range of motion.  Respiratory: Patient is breathing without any difficulty.   NEUROLOGICAL:     Awake, alert, oriented to person, place, and time.  Speech is clear and fluent.   Cranial Nerves: Pupils equal round and reactive to light.  Facial tone is symmetric.  Facial sensation is symmetric. Shoulder shrug is symmetric. Tongue protrusion is midline.  There is no pronator drift.  ROM of spine: full.    Strength: Side Biceps Triceps Deltoid Interossei Grip Wrist Ext. Wrist Flex.  R 5 5 5 5 5 5 5   L 5 5 5 5 5 5 5    Side Iliopsoas Quads Hamstring PF DF EHL  R 5 5 5 5 5 5   L 5 5 5 5 5 5    Reflexes are 1+ and symmetric at the biceps, triceps, brachioradialis, patella and achilles.   Hoffman's is absent.   Bilateral upper and lower extremity sensation is intact to light touch.    No evidence of dysmetria noted.  Gait is slowed and slightly stooped.     Medical Decision Making  Imaging: MRI L spine 11/11/2021 IMPRESSION: 1. Lumbar spine degeneration most notably affecting the facets at L3-4 and below. Progression at L3-4 since 2019, with advanced spinal stenosis at this level. 2. L4-5 bilateral subarticular recess narrowing that could affect the L5 nerve roots, especially on the right. 3. Mild-to-moderate foraminal narrowings at L3-4 and L4-5.      Electronically Signed   By: Jorje Guild M.D.   On: 11/13/2021 11:11  I have personally reviewed the images and agree with the above interpretation.  Assessment and Plan: Travis Santiago is a pleasant 72 y.o. male with neurogenic claudication due to lumbar spinal stenosis.  He has tried physical therapy and was discharged in December due to lack of improvement.  He is significantly limited by his current level of dysfunction.  We will proceed with L3-5 decompression to alleviate central compression at L3-4 and lateral recess compression at L4-5.    Travis Santiago K. Izora Ribas MD, [facility] Neurosurgery

## 2022-06-19 NOTE — Transfer of Care (Signed)
Immediate Anesthesia Transfer of Care Note  Patient: Travis Santiago  Procedure(s) Performed: L3-5 DECOMPRESSION (Spine Lumbar)  Patient Location: PACU  Anesthesia Type:General  Level of Consciousness: drowsy and patient cooperative  Airway & Oxygen Therapy: Patient Spontanous Breathing and Patient connected to face mask oxygen  Post-op Assessment: Report given to RN and Post -op Vital signs reviewed and stable  Post vital signs: Reviewed and stable  Last Vitals:  Vitals Value Taken Time  BP 106/75 06/19/22 1136  Temp 36.2 C 06/19/22 1136  Pulse 75 06/19/22 1138  Resp 12 06/19/22 1138  SpO2 99 % 06/19/22 1138  Vitals shown include unvalidated device data.  Last Pain:  Vitals:   06/19/22 0814  TempSrc: Temporal  PainSc: 6          Complications: No notable events documented.

## 2022-06-19 NOTE — Progress Notes (Signed)
Has been up with PT and is ambulating much better. He has attempted without success several times to void. Bladder scanned for 226 ml's. No bladder distention of discomfort noted. Will continue fluid intake. Is cleared from PT standpoint.

## 2022-06-19 NOTE — Anesthesia Procedure Notes (Signed)
Procedure Name: Intubation Date/Time: 06/19/2022 9:23 AM  Performed by: Jonna Clark, CRNAPre-anesthesia Checklist: Patient identified, Patient being monitored, Timeout performed, Emergency Drugs available and Suction available Patient Re-evaluated:Patient Re-evaluated prior to induction Oxygen Delivery Method: Circle system utilized Preoxygenation: Pre-oxygenation with 100% oxygen Induction Type: IV induction Ventilation: Mask ventilation without difficulty Laryngoscope Size: 3 and McGraph Grade View: Grade I Tube type: Oral Tube size: 7.0 mm Number of attempts: 1 Airway Equipment and Method: Stylet Placement Confirmation: ETT inserted through vocal cords under direct vision, positive ETCO2 and breath sounds checked- equal and bilateral Secured at: 21 cm Tube secured with: Tape Dental Injury: Teeth and Oropharynx as per pre-operative assessment

## 2022-06-19 NOTE — Discharge Summary (Signed)
Discharge Summary  Patient ID: Travis Santiago MRN: [id number] DOB/AGE: [birth year] 72 y.o.  Admit date: 06/19/2022 Discharge date: 06/19/2022  Admission Diagnoses: lumbar stenosis causing neurogenic claudication (ICD10 M48.062).   Discharge Diagnoses:  Active Problems:   Neurogenic claudication due to lumbar spinal stenosis   Discharged Condition: good  Hospital Course:  Travis Santiago is a 72 year old presenting with lumbar stenosis and neurogenic claudication status post L3-5 decompression.  His intraoperative course was uncomplicated and he was monitored in PACU postoperatively.  He was discharged home after ambulating, urinating, and tolerating p.o. intake.  He was given prescriptions for pain medication, muscle relaxer, and stool softener.  Consults: None  Significant Diagnostic Studies: none  Treatments: surgery: as above. Please see separately dictated operative report for further details  Discharge Exam: Blood pressure (!) 134/94, pulse 90, temperature (!) 97.3 F (36.3 C), temperature source Temporal, resp. rate 16, height 5\' 6"  (1.676 m), weight 86.2 kg, SpO2 92 %. CN II-XII grossly intact 5 out of 5 throughout bilateral lower extremities Incision clean dry intact with postoperative bandage in place.  Disposition: Discharge disposition: 01-Home or Self Care        Allergies as of 06/19/2022       Reactions   Other Anaphylaxis   Nitrates Analogues/food Throat swelling        Medication List     TAKE these medications    acetaminophen 500 MG tablet Commonly known as: TYLENOL Take 500 mg by mouth every 6 (six) hours as needed. Notes to patient: Recommended max. daily dose of acetaminophen is 4,000mg    atorvastatin 40 MG tablet Commonly known as: LIPITOR Take 1 tablet by mouth at bedtime.   chlorthalidone 25 MG tablet Commonly known as: HYGROTON Take 1 tablet by mouth at bedtime.   gabapentin 300 MG capsule Commonly known as:  NEURONTIN Take 300 mg by mouth at bedtime.   losartan 100 MG tablet Commonly known as: COZAAR Take 1 tablet by mouth at bedtime.   meloxicam 15 MG tablet Commonly known as: MOBIC Take 15 mg by mouth daily.   methocarbamol 500 MG tablet Commonly known as: ROBAXIN Take 1 tablet (500 mg total) by mouth 4 (four) times daily.   oxycodone 5 MG capsule Commonly known as: OXY-IR Take 1 capsule (5 mg total) by mouth every 4 (four) hours as needed for up to 5 days.   sertraline 25 MG tablet Commonly known as: ZOLOFT Take 25 mg by mouth at bedtime.        Follow-up Information     [name], PA-C Follow up on 07/04/2022.   Specialty: Neurosurgery Contact information: [place] [id number] [phone]                 Signed: Loleta Dicker 06/19/2022, 1:27 PM

## 2022-06-19 NOTE — Discharge Instructions (Addendum)
Your surgeon has performed an operation on your lumbar spine (low back) to relieve pressure on one or more nerves. Many times, patients feel better immediately after surgery and can "overdo it." Even if you feel well, it is important that you follow these activity guidelines. If you do not let your back heal properly from the surgery, you can increase the chance of a disc herniation and/or return of your symptoms. The following are instructions to help in your recovery once you have been discharged from the hospital.  * It is ok to take NSAIDs after surgery.  Activity    No bending, lifting, or twisting ("BLT"). Avoid lifting objects heavier than 10 pounds (gallon milk jug).  Where possible, avoid household activities that involve lifting, bending, pushing, or pulling such as laundry, vacuuming, grocery shopping, and childcare. Try to arrange for help from friends and family for these activities while your back heals.  Increase physical activity slowly as tolerated.  Taking short walks is encouraged, but avoid strenuous exercise. Do not jog, run, bicycle, lift weights, or participate in any other exercises unless specifically allowed by your doctor. Avoid prolonged sitting, including car rides.  Talk to your doctor before resuming sexual activity.  You should not drive until cleared by your doctor.  Until released by your doctor, you should not return to work or school.  You should rest at home and let your body heal.   You may shower three days after your surgery.  After showering, lightly dab your incision dry. Do not take a tub bath or go swimming for 3 weeks, or until approved by your doctor at your follow-up appointment.  If you smoke, we strongly recommend that you quit.  Smoking has been proven to interfere with normal healing in your back and will dramatically reduce the success rate of your surgery. Please contact [facility] (800-QUIT-NOW) and use the resources at www.[facility].com for  assistance in stopping smoking.  Surgical Incision   If you have a dressing on your incision, you may remove it three days after your surgery. Keep your incision area clean and dry.  If you have staples or stitches on your incision, you should have a follow up scheduled for removal. If you do not have staples or stitches, you will have steri-strips (small pieces of surgical tape) or Dermabond glue. The steri-strips/glue should begin to peel away within about a week (it is fine if the steri-strips fall off before then). If the strips are still in place one week after your surgery, you may gently remove them.  Diet            You may return to your usual diet. Be sure to stay hydrated.  When to Contact us  Although your surgery and recovery will likely be uneventful, you may have some residual numbness, aches, and pains in your back and/or legs. This is normal and should improve in the next few weeks.  However, should you experience any of the following, contact us immediately: New numbness or weakness Pain that is progressively getting worse, and is not relieved by your pain medications or rest Bleeding, redness, swelling, pain, or drainage from surgical incision Chills or flu-like symptoms Fever greater than 101.0 F (38.3 C) Problems with bowel or bladder functions Difficulty breathing or shortness of breath Warmth, tenderness, or swelling in your calf  Contact Information During office hours (Monday-Friday 9 am to 5 pm), please call your physician at [phone] and ask for Berdine Addison After hours and  weekends, please call [phone] and speak with the neurosurgeon on call For a life-threatening emergency, call Bernard   The drugs that you were given will stay in your system until tomorrow so for the next 24 hours you should not:  Drive an automobile Make any legal decisions Drink any alcoholic beverage   You may resume  regular meals tomorrow.  Today it is better to start with liquids and gradually work up to solid foods.  You may eat anything you prefer, but it is better to start with liquids, then soup and crackers, and gradually work up to solid foods.   Please notify your doctor immediately if you have any unusual bleeding, trouble breathing, redness and pain at the surgery site, drainage, fever, or pain not relieved by medication.     Your post-operative visit with Dr.                                       is: Date:                        Time:    Please call to schedule your post-operative visit.  Additional Instructions:

## 2022-06-19 NOTE — Progress Notes (Signed)
Up to bathroom. Able to void 266mls. Gait has improved. Tolerating po flluids and food.  Voices readiness for discharge.

## 2022-06-19 NOTE — Evaluation (Addendum)
Physical Therapy Evaluation Patient Details Name: Travis Santiago MRN: [id number] DOB: June 10, 1950 Today's Date: 06/19/2022  History of Present Illness  Patient is a 72 year old male s/p L3-5 lumbar decompression. PT was consulted to see today as patient had some unsteadiness with ambulation in the PACU and is anticipated to discharge today.   Clinical Impression  Patient is agreeable to PT. He was in the bathroom with nursing staff on arrival. Patient thinks his mobility is improving. Gait is impaired with short step length, narrow base of support, and intermittent decreased foot clearance bilaterally. With cues for gait kinematics, gait pattern improves. He was able to walk 31ft with Min guard assistance. Recommend to use rolling walker with ambulation for safety at this time and patient confirms he has a rolling walker in place at home. He was able to stand from the recliner chair with no physical assistance from therapist, cues for safety and technique. Reviewed logroll technique for comfort with bed mobility at home. PT will continue to follow while in the hospital to maximize independence and decrease caregiver burden.      Recommendations for follow up therapy are one component of a multi-disciplinary discharge planning process, led by the attending physician.  Recommendations may be updated based on patient status, additional functional criteria and insurance authorization.  Follow Up Recommendations       Assistance Recommended at Discharge Set up Supervision/Assistance  Patient can return home with the following  A little help with walking and/or transfers;Help with stairs or ramp for entrance;Assist for transportation;A little help with bathing/dressing/bathroom    Equipment Recommendations None recommended by PT  Recommendations for Other Services       Functional Status Assessment Patient has had a recent decline in their functional status and demonstrates the ability to  make significant improvements in function in a reasonable and predictable amount of time.     Precautions / Restrictions Precautions Precautions: Fall;Back Restrictions Weight Bearing Restrictions: No      Mobility  Bed Mobility               General bed mobility comments: not assessed. patient was educated on the logroll technique    Transfers Overall transfer level: Needs assistance Equipment used: Rolling walker (2 wheels) Transfers: Sit to/from Stand Sit to Stand: Supervision           General transfer comment: significant increased time required with cues for shifting weight forward and hand placement, but no physical assistance required.    Ambulation/Gait Ambulation/Gait assistance: Min guard Gait Distance (Feet): 75 Feet Assistive device: Rolling walker (2 wheels) Gait Pattern/deviations: Decreased stride length, Decreased dorsiflexion - right, Decreased dorsiflexion - left Gait velocity: decreased     General Gait Details: short step length and difficulty with foot clearance intermittently. cues to increase step length bilaterally and for increased foot clearance with carry over demonstrated. encouraged patient to use rolling walker for safety with ambulation  Stairs            Wheelchair Mobility    Modified Rankin (Stroke Patients Only)       Balance Overall balance assessment: Needs assistance Sitting-balance support: Feet supported Sitting balance-Leahy Scale: Fair     Standing balance support: Bilateral upper extremity supported Standing balance-Leahy Scale: Fair Standing balance comment: using rolling walker for support in standing                             Pertinent  Vitals/Pain Pain Assessment Pain Assessment: Faces Faces Pain Scale: Hurts a little bit Pain Descriptors / Indicators: Discomfort Pain Intervention(s): Limited activity within patient's tolerance, Monitored during session, Repositioned    Home  Living Family/patient expects to be discharged to:: Private residence Living Arrangements: Spouse/significant other Available Help at Discharge: Family Type of Home: House Home Access: Level entry       Home Layout: One level Home Equipment: Advice worker (2 wheels)      Prior Function Prior Level of Function : Independent/Modified Independent             Mobility Comments: no device for ambulation ADLs Comments: independent     Hand Dominance        Extremity/Trunk Assessment   Upper Extremity Assessment Upper Extremity Assessment: Overall WFL for tasks assessed    Lower Extremity Assessment Lower Extremity Assessment:  (4+/5 knee extension, dorsiflexion/plantarflexion 4+/5. no numbness reported)       Communication   Communication: No difficulties  Cognition Arousal/Alertness: Awake/alert Behavior During Therapy: WFL for tasks assessed/performed                                            General Comments General comments (skin integrity, edema, etc.): encouraged logroll technique for bed mobility and routine walking with rolling walker    Exercises     Assessment/Plan    PT Assessment Patient needs continued PT services  PT Problem List Decreased strength;Decreased range of motion;Decreased activity tolerance;Decreased balance;Decreased mobility;Decreased safety awareness       PT Treatment Interventions DME instruction;Gait training;Stair training;Functional mobility training;Therapeutic activities;Therapeutic exercise;Balance training;Neuromuscular re-education;Cognitive remediation;Patient/family education    PT Goals (Current goals can be found in the Care Plan section)  Acute Rehab PT Goals Patient Stated Goal: to go home PT Goal Formulation: With patient Time For Goal Achievement: 07/03/22 Potential to Achieve Goals: Fair    Frequency 7X/week     Co-evaluation               AM-PAC PT "6 Clicks"  Mobility  Outcome Measure Help needed turning from your back to your side while in a flat bed without using bedrails?: A Little Help needed moving from lying on your back to sitting on the side of a flat bed without using bedrails?: A Little Help needed moving to and from a bed to a chair (including a wheelchair)?: A Little Help needed standing up from a chair using your arms (e.g., wheelchair or bedside chair)?: A Little Help needed to walk in hospital room?: A Little Help needed climbing 3-5 steps with a railing? : A Little 6 Click Score: 18    End of Session   Activity Tolerance: Patient tolerated treatment well Patient left: in chair;with call bell/phone within reach;with family/visitor present Nurse Communication: Mobility status PT Visit Diagnosis: Unsteadiness on feet (R26.81);Muscle weakness (generalized) (M62.81)    Time: [id number] PT Time Calculation (min) (ACUTE ONLY): 15 min   Charges:   PT Evaluation $PT Eval Low Complexity: 1 Low PT Treatments $Therapeutic Activity: 8-22 mins        Minna Merritts, PT, MPT   Percell Locus 06/19/2022, 3:53 PM

## 2022-06-19 NOTE — Progress Notes (Addendum)
error 

## 2022-06-19 NOTE — Op Note (Signed)
Indications: Travis Santiago is suffering from lumbar stenosis causing neurogenic claudication (ICD10 M48.062). The patient tried and failed conservative management, prompting surgical intervention.  Findings: lumbar stenosis  Preoperative Diagnosis: Lumbar Stenosis with neurogenic claudication Postoperative Diagnosis: same   EBL: 25 ml IVF: see AR ml Drains: none Disposition: Extubated and Stable to PACU Complications: none  No foley catheter was placed.   Preoperative Note:  Risks of surgery discussed include: infection, bleeding, stroke, coma, death, paralysis, CSF leak, nerve/spinal cord injury, numbness, tingling, weakness, complex regional pain syndrome, recurrent stenosis and/or disc herniation, vascular injury, development of instability, neck/back pain, need for further surgery, persistent symptoms, development of deformity, and the risks of anesthesia. The patient understood these risks and agreed to proceed.  Operative Note:   1. L3-5 lumbar decompression including central laminectomy and bilateral medial facetectomies including foraminotomies  The patient was then brought from the preoperative center with intravenous access established.  The patient underwent general anesthesia and endotracheal tube intubation, and was then rotated on the Coward rail top where all pressure points were appropriately padded.  The skin was then thoroughly cleansed.  Perioperative antibiotic prophylaxis was administered.  Sterile prep and drapes were then applied and a timeout was then observed.  C-arm was brought into the field under sterile conditions and under lateral visualization the L4-5 interspace was identified and marked.  The incision was marked on the right and injected with local anesthetic. Once this was complete a 4 cm incision was opened with the use of a #10 blade knife.    The metrx tubes were sequentially advanced and confirmed in position at L4-5. An 64mm by 51mm tube was  locked in place to the bed side attachment.  The microscope was then sterilely brought into the field and muscle creep was hemostased with a bipolar and resected with a pituitary rongeur.  A Bovie extender was then used to expose the spinous process and lamina.  Careful attention was placed to not violate the facet capsule. A 3 mm matchstick drill bit was then used to make a hemi-laminotomy trough until the ligamentum flavum was exposed.  This was extended to the base of the spinous process and to the contralateral side to remove all the central bone from each side.  Once this was complete and the underlying ligamentum flavum was visualized, it was dissected with a curette and resected with Kerrison rongeurs.  Extensive ligamentum hypertrophy was noted, requiring a substantial amount of time and care for removal.  The dura was identified and palpated. The kerrison rongeur was then used to remove the medial facet bilaterally until no compression was noted.  A balltip probe was used to confirm decompression of the ipsilateral L5 nerve root.  Additional attention was paid to completion of the contralateral L4-5 foraminotomy until the contralateral traversing nerve root was completely free.  Once this was complete, L4-5 central decompression including medial facetectomy and foraminotomy was confirmed and decompression on both sides was confirmed. No CSF leak was noted.  Depo-Medrol was placed on the nerve root.  The wound was copiously irrigated. The tube system was then removed under microscopic visualization and hemostasis was obtained with a bipolar.    After performing the decompression at L4-5, the metrx tubes were sequentially advanced and confirmed in position at L3-4. An 28mm by 58mm tube was locked in place to the bed side attachment.  Fluoroscopy was then removed from the field.  The microscope was then sterilely brought into the field and muscle creep  was hemostased with a bipolar and resected with a  pituitary rongeur.  A Bovie extender was then used to expose the spinous process and lamina.  Careful attention was placed to not violate the facet capsule. A 3 mm matchstick drill bit was then used to make a hemi-laminotomy trough until the ligamentum flavum was exposed.  This was extended to the base of the spinous process and to the contralateral side to remove all the central bone from each side.  Once this was complete and the underlying ligamentum flavum was visualized, it was dissected with a curette and resected with Kerrison rongeurs.  Extensive ligamentum hypertrophy was noted, requiring a substantial amount of time and care for removal.  The dura was identified and palpated. The kerrison rongeur was then used to remove the medial facet bilaterally until no compression was noted.  A balltip probe was used to confirm decompression of the ipsilateral L4 nerve root.  Additional attention was paid to completion of the contralateral foraminotomy until the contralateral L4 nerve root was completely free.  Once this was complete, L3-4 central decompression including medial facetectomy and foraminotomy was confirmed and decompression on both sides was confirmed. No CSF leak was noted.  Depo-Medrol was placed on the nerve root.   The wound was copiously irrigated. The tube system was then removed under microscopic visualization and hemostasis was obtained with a bipolar.     The fascial layer was reapproximated with the use of a 0 Vicryl suture.  Subcutaneous tissue layer was reapproximated using 2-0 Vicryl suture.  3-0 monocryl was placed in subcuticular fashion. The skin was then cleansed and Dermabond was used to close the skin opening.  Patient was then rotated back to the preoperative bed awakened from anesthesia and taken to recovery all counts are correct in this case.  I performed the entire procedure with the assistance of Cooper Render PA as an Pensions consultant. An assistant was required for  this procedure due to the complexity.  The assistant provided assistance in tissue manipulation and suction, and was required for the successful and safe performance of the procedure. I performed the critical portions of the procedure.   Travis Santiago K. Izora Ribas MD

## 2022-06-19 NOTE — Progress Notes (Signed)
Up to bathroom with a walker and chair assist in back. Had some difficulty walking. States he "feels weak", but no new numbness. He was given an Oxycodone in PACU. Daughter states he usually takes only Tylenol at home. Unable to void and couldn't walk back from the bathroom. VSS. Will continue to monitor for stability. Dr Izora Ribas notified and Andee Poles is coming to evaluate him.

## 2022-06-19 NOTE — Anesthesia Preprocedure Evaluation (Addendum)
Anesthesia Evaluation  Patient identified by MRN, date of birth, ID band Patient awake    Reviewed: Allergy & Precautions, NPO status , Patient's Chart, lab work & pertinent test results  History of Anesthesia Complications Negative for: history of anesthetic complications  Airway Mallampati: III  TM Distance: >3 FB Neck ROM: Full   Comment: Large beard Dental no notable dental hx. (+) Teeth Intact   Pulmonary neg pulmonary ROS, neg sleep apnea, neg COPD, Patient abstained from smoking.Not current smoker, former smoker   Pulmonary exam normal breath sounds clear to auscultation       Cardiovascular Exercise Tolerance: Good METShypertension, Pt. on medications (-) CAD and (-) Past MI (-) dysrhythmias + Valvular Problems/Murmurs  Rhythm:Regular Rate:Normal - Systolic murmurs Stress test 05/2022 unremarkable  TTE 05/2022: INTERPRETATION  NORMAL LEFT VENTRICULAR SYSTOLIC FUNCTION   WITH MILD LVH  NORMAL RIGHT VENTRICULAR SYSTOLIC FUNCTION  MILD VALVULAR REGURGITATION (See above)  NO VALVULAR STENOSIS  ESTIMATED LVEF >55%  MILD-MODERATE PR     Neuro/Psych  PSYCHIATRIC DISORDERS Anxiety Depression    negative neurological ROS     GI/Hepatic ,GERD  Controlled,,(+)     (-) substance abuse    Endo/Other  neg diabetes    Renal/GU CRFRenal diseasenegative Renal ROS     Musculoskeletal  (+) Arthritis ,    Abdominal   Peds  Hematology   Anesthesia Other Findings Past Medical History: No date: Anxiety No date: Arthritis No date: Chronic lower back pain 01/2005: Colon cancer ([place])     Comment:  a.) s/p resection + adjuvant chemotherapy No date: Depression No date: Diastolic dysfunction     Comment:  a.) TTE 06/14/2022: EF >55%, mild LVH, triv TR, mild               MR/PR, G1DD No date: GERD (gastroesophageal reflux disease) No date: Hyperlipidemia No date: Hypertension No date: Pre-diabetes No date: Spinal  stenosis of lumbar region with neurogenic claudication No date: Vitamin D deficiency  Reproductive/Obstetrics                             Anesthesia Physical Anesthesia Plan  ASA: 2  Anesthesia Plan: General   Post-op Pain Management: Tylenol PO (pre-op)* and Dilaudid IV   Induction: Intravenous  PONV Risk Score and Plan: 3 and Ondansetron, Dexamethasone and Midazolam  Airway Management Planned: Oral ETT and Video Laryngoscope Planned  Additional Equipment: None  Intra-op Plan:   Post-operative Plan: Extubation in OR  Informed Consent: I have reviewed the patients History and Physical, chart, labs and discussed the procedure including the risks, benefits and alternatives for the proposed anesthesia with the patient or authorized representative who has indicated his/her understanding and acceptance.     Dental advisory given  Plan Discussed with: CRNA and Surgeon  Anesthesia Plan Comments: (Discussed risks of anesthesia with patient, including PONV, sore throat, lip/dental/eye damage. Rare risks discussed as well, such as cardiorespiratory and neurological sequelae, and allergic reactions. Discussed the role of CRNA in patient's perioperative care. Patient understands.)       Anesthesia Quick Evaluation

## 2022-06-20 ENCOUNTER — Encounter: Payer: Self-pay | Admitting: Neurosurgery

## 2022-06-21 ENCOUNTER — Telehealth: Payer: Self-pay

## 2022-06-21 DIAGNOSIS — Z4789 Encounter for other orthopedic aftercare: Secondary | ICD-10-CM | POA: Diagnosis not present

## 2022-06-21 DIAGNOSIS — I10 Essential (primary) hypertension: Secondary | ICD-10-CM | POA: Diagnosis not present

## 2022-06-21 DIAGNOSIS — Z981 Arthrodesis status: Secondary | ICD-10-CM | POA: Diagnosis not present

## 2022-06-21 DIAGNOSIS — Z85038 Personal history of other malignant neoplasm of large intestine: Secondary | ICD-10-CM | POA: Diagnosis not present

## 2022-06-21 DIAGNOSIS — M1990 Unspecified osteoarthritis, unspecified site: Secondary | ICD-10-CM | POA: Diagnosis not present

## 2022-06-21 NOTE — Telephone Encounter (Signed)
-----   Message from Peggyann Shoals sent at 06/21/2022  1:36 PM EDT ----- Regarding: [id number] Contact: [phone] L3-5 decompression on 06/19/22 Raquel Sarna from [facility] calling to report pain 8 out of 10. Once patient took his pain medication, she got him up and walking and doing exercises it was 7 out of 10.

## 2022-06-21 NOTE — Telephone Encounter (Signed)
Noted  

## 2022-06-24 DIAGNOSIS — Z4789 Encounter for other orthopedic aftercare: Secondary | ICD-10-CM | POA: Diagnosis not present

## 2022-06-24 DIAGNOSIS — Z981 Arthrodesis status: Secondary | ICD-10-CM | POA: Diagnosis not present

## 2022-06-24 DIAGNOSIS — Z85038 Personal history of other malignant neoplasm of large intestine: Secondary | ICD-10-CM | POA: Diagnosis not present

## 2022-06-24 DIAGNOSIS — I10 Essential (primary) hypertension: Secondary | ICD-10-CM | POA: Diagnosis not present

## 2022-06-24 DIAGNOSIS — M1990 Unspecified osteoarthritis, unspecified site: Secondary | ICD-10-CM | POA: Diagnosis not present

## 2022-06-26 DIAGNOSIS — Z85038 Personal history of other malignant neoplasm of large intestine: Secondary | ICD-10-CM | POA: Diagnosis not present

## 2022-06-26 DIAGNOSIS — Z4789 Encounter for other orthopedic aftercare: Secondary | ICD-10-CM | POA: Diagnosis not present

## 2022-06-26 DIAGNOSIS — M1990 Unspecified osteoarthritis, unspecified site: Secondary | ICD-10-CM | POA: Diagnosis not present

## 2022-06-26 DIAGNOSIS — Z981 Arthrodesis status: Secondary | ICD-10-CM | POA: Diagnosis not present

## 2022-06-26 DIAGNOSIS — I10 Essential (primary) hypertension: Secondary | ICD-10-CM | POA: Diagnosis not present

## 2022-06-28 DIAGNOSIS — M1990 Unspecified osteoarthritis, unspecified site: Secondary | ICD-10-CM | POA: Diagnosis not present

## 2022-06-28 DIAGNOSIS — Z4789 Encounter for other orthopedic aftercare: Secondary | ICD-10-CM | POA: Diagnosis not present

## 2022-06-28 DIAGNOSIS — I10 Essential (primary) hypertension: Secondary | ICD-10-CM | POA: Diagnosis not present

## 2022-06-28 DIAGNOSIS — Z981 Arthrodesis status: Secondary | ICD-10-CM | POA: Diagnosis not present

## 2022-06-28 DIAGNOSIS — Z85038 Personal history of other malignant neoplasm of large intestine: Secondary | ICD-10-CM | POA: Diagnosis not present

## 2022-07-01 DIAGNOSIS — M1990 Unspecified osteoarthritis, unspecified site: Secondary | ICD-10-CM | POA: Diagnosis not present

## 2022-07-01 DIAGNOSIS — Z85038 Personal history of other malignant neoplasm of large intestine: Secondary | ICD-10-CM | POA: Diagnosis not present

## 2022-07-01 DIAGNOSIS — I10 Essential (primary) hypertension: Secondary | ICD-10-CM | POA: Diagnosis not present

## 2022-07-01 DIAGNOSIS — Z4789 Encounter for other orthopedic aftercare: Secondary | ICD-10-CM | POA: Diagnosis not present

## 2022-07-01 DIAGNOSIS — Z981 Arthrodesis status: Secondary | ICD-10-CM | POA: Diagnosis not present

## 2022-07-02 NOTE — Progress Notes (Signed)
   REFERRING PHYSICIAN:  Marisue Ivan, Md [address] [place],  [place] [address]  DOS: 06/19/22 L3-5 lumbar decompression including central laminectomy and bilateral medial facetectomies including foraminotomies   HISTORY OF PRESENT ILLNESS: Travis Santiago is approximately 2 weeks status post L3-5 lumbar decompression including central laminectomy and bilateral medial facetectomies including foraminotomies . Was given oxycodone and robaxin on discharge from the hospital.   He was having LBP and bilateral leg pain prior to his surgery- this is better but he still has some pain. He is not taking oxycodone. He is taking prn robaxin. He would like to restart his mobic.    PHYSICAL EXAMINATION:  General: Patient is well developed, well nourished, calm, collected, and in no apparent distress.   NEUROLOGICAL:  General: In no acute distress.   Awake, alert, oriented to person, place, and time.  Pupils equal round and reactive to light.  Facial tone is symmetric.     Strength:            Side Iliopsoas Quads Hamstring PF DF EHL  R 5 5 5 5 5 5   L 5 5 5 5 5 5    Incision c/d/i   ROS (Neurologic):  Negative except as noted above  IMAGING: Nothing new to review.   ASSESSMENT/PLAN:  Travis Santiago is doing well s/p above surgery. Treatment options reviewed with patient and following plan made:   - I have advised the patient to lift up to 10 pounds until 6 weeks after surgery (follow up with Dr. Myer Haff).  - Reviewed wound care.  - No bending, twisting, or lifting.  - Okay to restart mobic. He asks about avoiding coffee and OJ with this medication. He will discuss further with his pharmacist.  - He can continue prn robaxin for spasms.  - He has been feeling run down since surgery. If this does not improve, I recommend that he follow up with his PCP.  - Follow up as scheduled in 4 weeks and prn.   Advised to contact the office if any questions  or concerns arise.  Drake Leach PA-C Department of neurosurgery

## 2022-07-03 DIAGNOSIS — M1990 Unspecified osteoarthritis, unspecified site: Secondary | ICD-10-CM | POA: Diagnosis not present

## 2022-07-03 DIAGNOSIS — Z4789 Encounter for other orthopedic aftercare: Secondary | ICD-10-CM | POA: Diagnosis not present

## 2022-07-03 DIAGNOSIS — Z981 Arthrodesis status: Secondary | ICD-10-CM | POA: Diagnosis not present

## 2022-07-03 DIAGNOSIS — Z85038 Personal history of other malignant neoplasm of large intestine: Secondary | ICD-10-CM | POA: Diagnosis not present

## 2022-07-03 DIAGNOSIS — I10 Essential (primary) hypertension: Secondary | ICD-10-CM | POA: Diagnosis not present

## 2022-07-04 ENCOUNTER — Encounter: Payer: Self-pay | Admitting: Orthopedic Surgery

## 2022-07-04 ENCOUNTER — Ambulatory Visit (INDEPENDENT_AMBULATORY_CARE_PROVIDER_SITE_OTHER): Payer: Medicare HMO | Admitting: Orthopedic Surgery

## 2022-07-04 VITALS — BP 110/77 | HR 76 | Ht 66.0 in | Wt 193.0 lb

## 2022-07-04 DIAGNOSIS — Z9889 Other specified postprocedural states: Secondary | ICD-10-CM

## 2022-07-04 DIAGNOSIS — M48062 Spinal stenosis, lumbar region with neurogenic claudication: Secondary | ICD-10-CM

## 2022-07-04 DIAGNOSIS — Z09 Encounter for follow-up examination after completed treatment for conditions other than malignant neoplasm: Secondary | ICD-10-CM

## 2022-07-04 NOTE — Patient Instructions (Signed)
It was nice to see you today.   I am glad that you are feeling better!   Okay to get incision wet in the shower, do not submerge in pool or hot tub.   Call if any concerns about the incision such as redness, drainage, or fever/chills.   No bending, twisting, or lifting. You can lift up to 10 pounds until your follow up with Dr.  Myer Haff in 4 weeks.   You can restart your meloxicam. I would ask the pharmacist if you can drink coffee/orange juice with this.   You can drive if you are not taking the oxycodone and you feel like you could slam on the brakes if needed.   You do not have to take the methocarbamol unless you need it. You can take with mobic if needed. This medications helps with muscle spasms and can make you sleepy.   Please call with any questions or concerns.   Drake Leach PA-C [phone]

## 2022-07-05 DIAGNOSIS — Z4789 Encounter for other orthopedic aftercare: Secondary | ICD-10-CM | POA: Diagnosis not present

## 2022-07-05 DIAGNOSIS — Z981 Arthrodesis status: Secondary | ICD-10-CM | POA: Diagnosis not present

## 2022-07-05 DIAGNOSIS — M1990 Unspecified osteoarthritis, unspecified site: Secondary | ICD-10-CM | POA: Diagnosis not present

## 2022-07-05 DIAGNOSIS — I10 Essential (primary) hypertension: Secondary | ICD-10-CM | POA: Diagnosis not present

## 2022-07-05 DIAGNOSIS — Z85038 Personal history of other malignant neoplasm of large intestine: Secondary | ICD-10-CM | POA: Diagnosis not present

## 2022-07-16 DIAGNOSIS — Z09 Encounter for follow-up examination after completed treatment for conditions other than malignant neoplasm: Secondary | ICD-10-CM | POA: Diagnosis not present

## 2022-07-16 DIAGNOSIS — Z85038 Personal history of other malignant neoplasm of large intestine: Secondary | ICD-10-CM | POA: Diagnosis not present

## 2022-07-17 DIAGNOSIS — R7303 Prediabetes: Secondary | ICD-10-CM | POA: Diagnosis not present

## 2022-07-17 DIAGNOSIS — I10 Essential (primary) hypertension: Secondary | ICD-10-CM | POA: Diagnosis not present

## 2022-07-17 DIAGNOSIS — E7800 Pure hypercholesterolemia, unspecified: Secondary | ICD-10-CM | POA: Diagnosis not present

## 2022-07-18 ENCOUNTER — Other Ambulatory Visit: Payer: Self-pay | Admitting: Neurosurgery

## 2022-07-30 DIAGNOSIS — N289 Disorder of kidney and ureter, unspecified: Secondary | ICD-10-CM | POA: Diagnosis not present

## 2022-07-30 DIAGNOSIS — E7800 Pure hypercholesterolemia, unspecified: Secondary | ICD-10-CM | POA: Diagnosis not present

## 2022-07-30 DIAGNOSIS — E6609 Other obesity due to excess calories: Secondary | ICD-10-CM | POA: Diagnosis not present

## 2022-07-30 DIAGNOSIS — Z6831 Body mass index (BMI) 31.0-31.9, adult: Secondary | ICD-10-CM | POA: Diagnosis not present

## 2022-07-30 DIAGNOSIS — E1169 Type 2 diabetes mellitus with other specified complication: Secondary | ICD-10-CM | POA: Diagnosis not present

## 2022-07-30 DIAGNOSIS — I952 Hypotension due to drugs: Secondary | ICD-10-CM | POA: Diagnosis not present

## 2022-08-01 ENCOUNTER — Encounter: Payer: Self-pay | Admitting: Neurosurgery

## 2022-08-01 ENCOUNTER — Ambulatory Visit (INDEPENDENT_AMBULATORY_CARE_PROVIDER_SITE_OTHER): Payer: Medicare HMO | Admitting: Neurosurgery

## 2022-08-01 VITALS — BP 110/60 | Ht 66.0 in | Wt 193.0 lb

## 2022-08-01 DIAGNOSIS — M48062 Spinal stenosis, lumbar region with neurogenic claudication: Secondary | ICD-10-CM

## 2022-08-01 DIAGNOSIS — Z09 Encounter for follow-up examination after completed treatment for conditions other than malignant neoplasm: Secondary | ICD-10-CM

## 2022-08-01 NOTE — Progress Notes (Signed)
   REFERRING PHYSICIAN:  Marisue Ivan, Md [address] [place],  [place] [address]  DOS: 06/19/22 L3-5 lumbar decompression including central laminectomy and bilateral medial facetectomies including foraminotomies   HISTORY OF PRESENT ILLNESS: Travis Santiago is status post L3-5 lumbar decompression including central laminectomy and bilateral medial facetectomies including foraminotomies .   He is doing very well.  His pain is much improved.  PHYSICAL EXAMINATION:  General: Patient is well developed, well nourished, calm, collected, and in no apparent distress.   NEUROLOGICAL:  General: In no acute distress.   Awake, alert, oriented to person, place, and time.  Pupils equal round and reactive to light.  Facial tone is symmetric.     Strength:            Side Iliopsoas Quads Hamstring PF DF EHL  R 5 5 5 5 5 5   L 5 5 5 5 5 5    Incision c/d/i   ROS (Neurologic):  Negative except as noted above  IMAGING: Nothing new to review.   ASSESSMENT/PLAN:  Travis Santiago is doing well s/p above surgery.  I am very pleased with his recovery.  We will see him back in approximately 6 weeks.  We reviewed his activity limitations.  Venetia Night, MD  Department of neurosurgery

## 2022-08-06 ENCOUNTER — Other Ambulatory Visit: Payer: Self-pay | Admitting: Neurosurgery

## 2022-08-07 NOTE — Telephone Encounter (Signed)
He said that he has plenty of medication and does not need this refill, please cancel.

## 2022-08-07 NOTE — Telephone Encounter (Signed)
LVM for pt to call back regarding rx.

## 2022-08-07 NOTE — Telephone Encounter (Signed)
Please call him and see if he needs refill of robaxin. How often is he taking it?

## 2022-08-14 DIAGNOSIS — E785 Hyperlipidemia, unspecified: Secondary | ICD-10-CM | POA: Diagnosis not present

## 2022-08-14 DIAGNOSIS — E7800 Pure hypercholesterolemia, unspecified: Secondary | ICD-10-CM | POA: Diagnosis not present

## 2022-08-14 DIAGNOSIS — E1169 Type 2 diabetes mellitus with other specified complication: Secondary | ICD-10-CM | POA: Diagnosis not present

## 2022-08-14 DIAGNOSIS — G20C Parkinsonism, unspecified: Secondary | ICD-10-CM | POA: Diagnosis not present

## 2022-09-02 ENCOUNTER — Telehealth: Payer: Self-pay | Admitting: Neurosurgery

## 2022-09-02 MED ORDER — METHYLPREDNISOLONE 4 MG PO TBPK: ORAL_TABLET | ORAL | 0 refills | Status: DC

## 2022-09-02 NOTE — Telephone Encounter (Signed)
Has been in extreme pain in his lower back and hips since last Thursday 08/29/2022. Has been taking tylenol, no ice or heat. Sharp throbbing pain.   CB: [phone]

## 2022-09-02 NOTE — Telephone Encounter (Signed)
I spoke with Mr Festa. He reports he doesn't have any pain with sitting. When he stands up, he has excruciating pain in his buttocks, hips, and legs. This started Thursday after carrying groceries. He typically takes methocarbamol as needed, but hasn't used it in the past 2-3 days.  I offered him an appointment any day this week, but he reports his wife doesn't drive and he can't walk without severe pain.   I discussed the above with Stacy. We will send a medrol dosepack. I advised him of common side effects. He has been advised that if this doesn't work, he needs to come in for an appointment. He will resume tylenol and methocarbamol (he states he has plenty left, but will call if he needs a refill).  I also advised him that if he develops and symptoms such as leg weakness or bowel/bladder issues, he should proceed to the emergency room. He verbalized understanding of the above.   He will have his daughter pick up the rx after she gets off work or see if [facility].

## 2022-09-09 NOTE — Progress Notes (Unsigned)
   REFERRING PHYSICIAN:  Marisue Ivan, Md [address] [place],  [place] [address]  DOS: 06/19/22 L3-5 lumbar decompression including central laminectomy and bilateral medial facetectomies including foraminotomies   HISTORY OF PRESENT ILLNESS: Travis Santiago is status post L3-5 lumbar decompression including central laminectomy and bilateral medial facetectomies including foraminotomies.  He was doing well at his last visit. He called on 09/02/22 with severe pain when standing/walking. He was called in a medrol dose pack and advised to restart robaxin prn.   He is here for follow up.   2-3 weeks of intermittent pain in his lower back and posterior legs to his knees with walking and standing. No pain with sitting. Pain can be severe. No relief with medrol dose pack. He is taking prn OTC tylneol and neurontin with not much improvement. No weakness in legs. No numbness or tingling.    PHYSICAL EXAMINATION:  General: Patient is well developed, well nourished, calm, collected, and in no apparent distress.   NEUROLOGICAL:  General: In no acute distress.   Awake, alert, oriented to person, place, and time.  Pupils equal round and reactive to light.  Facial tone is symmetric.     Strength:            Side Iliopsoas Quads Hamstring PF DF EHL  R 5 5 5 5 5 5   L 5 5 5 5 5 5    Incision well healed. No lumbar tenderness noted.   He has pain with ambulation.    ROS (Neurologic):  Negative except as noted above  IMAGING: Nothing new to review.   ASSESSMENT/PLAN:  Travis Santiago is having increased pain s/p above surgery.  He has severe LBP and bilateral posterior leg pain to his kness with standing and walking. No relief with medrol dose pack. No known injury.   Treatment options reviewed with patient and following plan made:   - MRI of lumbar spine to further evaluate new symptoms s/p surgery.  - Continue prn OTC tylenol as directed.  - Continue  prn robaxin. He knows this can make him sleepy.  - Continue neurontin at night.  - Limited refill of oxycodone for severe pain. Reviewed dosing and side effects. PMP reviewed and is appropriate.  - Will do phone visit to review MRI once I have results back.   Advised to contact the office if any questions or concerns arise.  Drake Leach PA-C Department of neurosurgery

## 2022-09-10 ENCOUNTER — Ambulatory Visit (INDEPENDENT_AMBULATORY_CARE_PROVIDER_SITE_OTHER): Payer: Medicare HMO | Admitting: Orthopedic Surgery

## 2022-09-10 ENCOUNTER — Encounter: Payer: Self-pay | Admitting: Orthopedic Surgery

## 2022-09-10 VITALS — BP 122/84 | Ht 66.0 in | Wt 193.0 lb

## 2022-09-10 DIAGNOSIS — M48062 Spinal stenosis, lumbar region with neurogenic claudication: Secondary | ICD-10-CM

## 2022-09-10 DIAGNOSIS — M5416 Radiculopathy, lumbar region: Secondary | ICD-10-CM

## 2022-09-10 DIAGNOSIS — Z9889 Other specified postprocedural states: Secondary | ICD-10-CM

## 2022-09-10 DIAGNOSIS — Z09 Encounter for follow-up examination after completed treatment for conditions other than malignant neoplasm: Secondary | ICD-10-CM

## 2022-09-10 MED ORDER — OXYCODONE HCL 5 MG PO TABS: 5.0000 mg | ORAL_TABLET | Freq: Two times a day (BID) | ORAL | 0 refills | Status: DC | PRN

## 2022-09-10 NOTE — Patient Instructions (Addendum)
It was so nice to see you today. Thank you so much for coming in.    I am sorry that you are hurting so much.   I want to get an MRI of your lower back to look into things further. We will get this approved through your insurance and [facility] will call you to schedule the appointment.   Continue on over the counter tylenol to help with pain and inflammation. Take as directed on the bottle with food.   Continue with methocarbamol to help with muscle spasms. Use only as needed and be careful, this can make you sleepy.   Continue on gabapentin at night.   I sent a few oxycodone to your pharmacy to take for severe pain. Take only as needed. Remember, this can make you sleepy and/or constipated.   It will take 5-7 days for me to get the MRI results after you have the scan done. Once I have them, we will call you to set up a phone visit to review the results.   Please do not hesitate to call if you have any questions or concerns. You can also message me in MyChart.   Drake Leach PA-C [phone]

## 2022-09-24 ENCOUNTER — Ambulatory Visit
Admission: RE | Admit: 2022-09-24 | Discharge: 2022-09-24 | Disposition: A | Discharge status: Home / Self Care | Payer: Medicare HMO | Source: Ambulatory Visit | Attending: Orthopedic Surgery | Admitting: Orthopedic Surgery

## 2022-09-24 ENCOUNTER — Telehealth: Payer: Self-pay | Admitting: Orthopedic Surgery

## 2022-09-24 DIAGNOSIS — Z9889 Other specified postprocedural states: Secondary | ICD-10-CM | POA: Diagnosis not present

## 2022-09-24 DIAGNOSIS — M47816 Spondylosis without myelopathy or radiculopathy, lumbar region: Secondary | ICD-10-CM | POA: Diagnosis not present

## 2022-09-24 DIAGNOSIS — M5136 Other intervertebral disc degeneration, lumbar region: Secondary | ICD-10-CM | POA: Diagnosis not present

## 2022-09-24 DIAGNOSIS — M2548 Effusion, other site: Secondary | ICD-10-CM | POA: Diagnosis not present

## 2022-09-24 DIAGNOSIS — M48061 Spinal stenosis, lumbar region without neurogenic claudication: Secondary | ICD-10-CM | POA: Diagnosis not present

## 2022-09-24 DIAGNOSIS — M5416 Radiculopathy, lumbar region: Secondary | ICD-10-CM

## 2022-09-24 MED ORDER — OXYCODONE HCL 5 MG PO TABS: 5.0000 mg | ORAL_TABLET | Freq: Two times a day (BID) | ORAL | 0 refills | Status: DC | PRN

## 2022-09-24 NOTE — Telephone Encounter (Signed)
Can do one more refill of oxycodone. PMP reviewed and is appropriate.   MRI done today- waiting to be read.   Please let him know this was sent to pharmacy and remind him to take only as needed for severe pain.

## 2022-09-24 NOTE — Telephone Encounter (Signed)
Pt is going out of town doesn't want to run out and would like a refill on the oxycodone 5mg  [facility] [address]

## 2022-09-25 ENCOUNTER — Telehealth: Payer: Self-pay | Admitting: Neurosurgery

## 2022-09-25 NOTE — Telephone Encounter (Signed)
Handicapped placard form done. Given to Patty to mail to him. Please call and let him know.

## 2022-09-25 NOTE — Telephone Encounter (Signed)
Patient aware that form was signed and put in the mail.

## 2022-09-25 NOTE — Telephone Encounter (Signed)
L3-5 decompression on 06/19/22  Can you renew his handicap placard? If you do can you just put it in the mail for him.

## 2022-10-01 ENCOUNTER — Encounter: Payer: Self-pay | Admitting: Orthopedic Surgery

## 2022-10-01 NOTE — Progress Notes (Signed)
Lumbar MRI dated 09/24/22:  FINDINGS: Segmentation:  Standard.   Alignment:  Physiologic.   Vertebrae: No acute fracture, evidence of discitis, or aggressive bone lesion.   Conus medullaris and cauda equina: Conus extends to the L1 level. Conus and cauda equina appear normal.   Paraspinal and other soft tissues: No acute paraspinal abnormality.   Disc levels:   Disc spaces: Disc spaces are preserved.   T12-L1: No significant disc bulge. Mild bilateral facet arthropathy. No foraminal or central canal stenosis.   L1-L2: Mild broad-based disc bulge. Mild bilateral facet arthropathy. No foraminal or central canal stenosis.   L2-L3: Mild broad-based disc bulge. Mild bilateral facet arthropathy. No foraminal or central canal stenosis.   L3-L4: Broad-based disc bulge. Moderate bilateral facet arthropathy with bilateral facet effusions and a 11 mm right facet intraspinal synovial cyst with mass effect on the right posterolateral thecal sac. Severe spinal stenosis. Moderate bilateral foraminal stenosis.   L4-L5: Mild broad-based disc bulge. Moderate bilateral facet arthropathy. Bilateral subarticular recess stenosis. Mild spinal stenosis. Moderate bilateral foraminal stenosis.   L5-S1: No significant disc bulge. No neural foraminal stenosis. No central canal stenosis. Mild bilateral facet arthropathy.   IMPRESSION: 1. At L3-4 there is a broad-based disc bulge. Moderate bilateral facet arthropathy with bilateral facet effusions and a 11 mm right facet intraspinal synovial cyst with mass effect on the right posterolateral thecal sac. Severe spinal stenosis. Moderate bilateral foraminal stenosis. 2. At L4-5 there is a mild broad-based disc bulge. Moderate bilateral facet arthropathy. Bilateral subarticular recess stenosis. Mild spinal stenosis. Moderate bilateral foraminal stenosis. 3. No acute osseous injury of the lumbar spine.     Electronically Signed   By: Elige Ko  M.D.   On: 10/01/2022 07:46  I have personally reviewed the images and agree with the above interpretation.   MRI reviewed with Dr. Myer Haff. Increased symptoms likely from synovial cyst at L3-L4. He recommends bilateral L3-L4 ESI with PMR at [facility] and PT. If no improvement, can consider decompression on left at L3-L4.

## 2022-10-03 ENCOUNTER — Telehealth: Payer: Self-pay | Admitting: Neurosurgery

## 2022-10-03 DIAGNOSIS — G8929 Other chronic pain: Secondary | ICD-10-CM | POA: Diagnosis not present

## 2022-10-03 DIAGNOSIS — R262 Difficulty in walking, not elsewhere classified: Secondary | ICD-10-CM | POA: Diagnosis not present

## 2022-10-03 DIAGNOSIS — M48062 Spinal stenosis, lumbar region with neurogenic claudication: Secondary | ICD-10-CM

## 2022-10-03 DIAGNOSIS — R29898 Other symptoms and signs involving the musculoskeletal system: Secondary | ICD-10-CM | POA: Diagnosis not present

## 2022-10-03 DIAGNOSIS — F411 Generalized anxiety disorder: Secondary | ICD-10-CM | POA: Diagnosis not present

## 2022-10-03 DIAGNOSIS — M545 Low back pain, unspecified: Secondary | ICD-10-CM | POA: Diagnosis not present

## 2022-10-03 DIAGNOSIS — G20C Parkinsonism, unspecified: Secondary | ICD-10-CM | POA: Diagnosis not present

## 2022-10-03 DIAGNOSIS — M5416 Radiculopathy, lumbar region: Secondary | ICD-10-CM

## 2022-10-03 DIAGNOSIS — Z9889 Other specified postprocedural states: Secondary | ICD-10-CM

## 2022-10-03 DIAGNOSIS — R2689 Other abnormalities of gait and mobility: Secondary | ICD-10-CM | POA: Diagnosis not present

## 2022-10-03 NOTE — Telephone Encounter (Signed)
No answer, unable to leave message- mailbox full

## 2022-10-03 NOTE — Telephone Encounter (Signed)
Rx for lift chair

## 2022-10-03 NOTE — Telephone Encounter (Signed)
He does need a rx for the lift chair to take to the medical supply store.

## 2022-10-03 NOTE — Telephone Encounter (Signed)
Pt said in same call that he did not rcv his paper for the handicap placard. Wanted to know if we could fill out another and mail to him please.

## 2022-10-03 NOTE — Telephone Encounter (Signed)
Sounds like this has already been taken care of. If we need to do anything, please let us know. Thanks

## 2022-10-03 NOTE — Telephone Encounter (Signed)
Order placed

## 2022-10-03 NOTE — Telephone Encounter (Signed)
L3-5 decompression on 06/19/22  Patient would like a rx for a lift chair to take to a medical supply store. Please call him when ready.

## 2022-10-03 NOTE — Telephone Encounter (Signed)
Was picked up by [facility] mail carrier on 7/8, he needs to give it a few more days to be delivered.

## 2022-10-04 NOTE — Telephone Encounter (Signed)
Patient requested the order to be out in the mail and it has been done.

## 2022-10-09 ENCOUNTER — Encounter: Payer: Self-pay | Admitting: Gastroenterology

## 2022-10-09 NOTE — Progress Notes (Signed)
Telephone Visit- Progress Note: Referring Physician:  Marisue Ivan, MD [phone] [facility] [address] [facility] [place],  [place] [address]  Primary Physician:  Travis Ivan, MD  This visit was performed via telephone.  Patient location: home Provider location: office  I spent a total of 10 minutes non-face-to-face activities for this visit on the date of this encounter including review of current clinical condition and response to treatment.    Patient has given verbal consent to this telephone visits and we reviewed the limitations of a telephone visit. Patient wishes to proceed.    Chief Complaint:  review MRI results  History of Present Illness: Travis Santiago is a 72 y.o. male has a history of colon CA, GERD, hyperlipidemia, HTN.   He is status post L3-5 lumbar decompression including central laminectomy and bilateral medial facetectomies including foraminotomies on 06/19/22.   Started with severe back pain when standing/walking on 09/02/22. No relief with medrol dose pack.   Phone visit to review his lumbar MRI results.   Neurology recently increased his neurontin to 300mg  bid.   He is feeling much better! If his pain was a 10 at his last visit, it is now a 4. He still  has some intermittent LBP, but it is tolerable. He is able to walk and stand without severe pain. No numbness, tingling, or weakness.   Exam: No exam done as this was a telephone encounter.     Imaging: Lumbar MRI dated 09/24/22:  FINDINGS: Segmentation:  Standard.   Alignment:  Physiologic.   Vertebrae: No acute fracture, evidence of discitis, or aggressive bone lesion.   Conus medullaris and cauda equina: Conus extends to the L1 level. Conus and cauda equina appear normal.   Paraspinal and other soft tissues: No acute paraspinal abnormality.   Disc levels:   Disc spaces: Disc spaces are preserved.   T12-L1: No significant disc bulge. Mild bilateral facet arthropathy. No  foraminal or central canal stenosis.   L1-L2: Mild broad-based disc bulge. Mild bilateral facet arthropathy. No foraminal or central canal stenosis.   L2-L3: Mild broad-based disc bulge. Mild bilateral facet arthropathy. No foraminal or central canal stenosis.   L3-L4: Broad-based disc bulge. Moderate bilateral facet arthropathy with bilateral facet effusions and a 11 mm right facet intraspinal synovial cyst with mass effect on the right posterolateral thecal sac. Severe spinal stenosis. Moderate bilateral foraminal stenosis.   L4-L5: Mild broad-based disc bulge. Moderate bilateral facet arthropathy. Bilateral subarticular recess stenosis. Mild spinal stenosis. Moderate bilateral foraminal stenosis.   L5-S1: No significant disc bulge. No neural foraminal stenosis. No central canal stenosis. Mild bilateral facet arthropathy.   IMPRESSION: 1. At L3-4 there is a broad-based disc bulge. Moderate bilateral facet arthropathy with bilateral facet effusions and a 11 mm right facet intraspinal synovial cyst with mass effect on the right posterolateral thecal sac. Severe spinal stenosis. Moderate bilateral foraminal stenosis. 2. At L4-5 there is a mild broad-based disc bulge. Moderate bilateral facet arthropathy. Bilateral subarticular recess stenosis. Mild spinal stenosis. Moderate bilateral foraminal stenosis. 3. No acute osseous injury of the lumbar spine.     Electronically Signed   By: Elige Santiago M.D.   On: 10/01/2022 07:46  I have personally reviewed the images and agree with the above interpretation.  Above MRI reviewed with Travis Santiago prior to his visit.   Assessment and Plan: [name] is a pleasant 72 y.o. male is status post L3-5 lumbar decompression including central laminectomy and bilateral medial facetectomies including foraminotomies  on 06/19/22.   He is feeling much better! If his pain was a 10 at his last visit, it is now a 4. He still  has some intermittent  LBP, but it is tolerable. He is able to walk and stand without severe pain. No numbness, tingling, or weakness.  Increased symptoms likely from synovial cyst at L3-L4.    Treatment options reviewed with patient and following plan made:    - Recommend revisiting PT for his lumbar spine along with bilateral L3-L4 ESI.  - He is feeling so much better that he wants to hold on further treatment.  - He will continue on neurontin from neurology.  - He will try stopping the robaxin as he's not sure it's helping.  - If pain gets worse, we can revisit PT and/or injections.  - He will f/u with Travis Santiago in 2 months and prn.   Travis Leach PA-C Neurosurgery

## 2022-10-10 ENCOUNTER — Ambulatory Visit: Admission: RE | Admit: 2022-10-10 | Payer: Medicare HMO | Source: Home / Self Care | Admitting: Gastroenterology

## 2022-10-10 ENCOUNTER — Encounter: Admission: RE | Payer: Self-pay | Source: Home / Self Care

## 2022-10-10 SURGERY — COLONOSCOPY WITH PROPOFOL
Anesthesia: General

## 2022-10-11 ENCOUNTER — Encounter: Payer: Self-pay | Admitting: Orthopedic Surgery

## 2022-10-11 ENCOUNTER — Ambulatory Visit (INDEPENDENT_AMBULATORY_CARE_PROVIDER_SITE_OTHER): Payer: Medicare HMO | Admitting: Orthopedic Surgery

## 2022-10-11 DIAGNOSIS — G20C Parkinsonism, unspecified: Secondary | ICD-10-CM | POA: Diagnosis not present

## 2022-10-11 DIAGNOSIS — E7800 Pure hypercholesterolemia, unspecified: Secondary | ICD-10-CM | POA: Diagnosis not present

## 2022-10-11 DIAGNOSIS — Z09 Encounter for follow-up examination after completed treatment for conditions other than malignant neoplasm: Secondary | ICD-10-CM | POA: Diagnosis not present

## 2022-10-11 DIAGNOSIS — E785 Hyperlipidemia, unspecified: Secondary | ICD-10-CM | POA: Diagnosis not present

## 2022-10-11 DIAGNOSIS — M48062 Spinal stenosis, lumbar region with neurogenic claudication: Secondary | ICD-10-CM | POA: Diagnosis not present

## 2022-10-11 DIAGNOSIS — Z9889 Other specified postprocedural states: Secondary | ICD-10-CM

## 2022-10-11 DIAGNOSIS — M5416 Radiculopathy, lumbar region: Secondary | ICD-10-CM | POA: Diagnosis not present

## 2022-10-11 DIAGNOSIS — E1169 Type 2 diabetes mellitus with other specified complication: Secondary | ICD-10-CM | POA: Diagnosis not present

## 2022-10-14 DIAGNOSIS — E6609 Other obesity due to excess calories: Secondary | ICD-10-CM | POA: Diagnosis not present

## 2022-10-14 DIAGNOSIS — R7303 Prediabetes: Secondary | ICD-10-CM | POA: Diagnosis not present

## 2022-10-14 DIAGNOSIS — G8929 Other chronic pain: Secondary | ICD-10-CM | POA: Diagnosis not present

## 2022-10-14 DIAGNOSIS — R42 Dizziness and giddiness: Secondary | ICD-10-CM | POA: Diagnosis not present

## 2022-10-14 DIAGNOSIS — E7800 Pure hypercholesterolemia, unspecified: Secondary | ICD-10-CM | POA: Diagnosis not present

## 2022-10-14 DIAGNOSIS — Z6833 Body mass index (BMI) 33.0-33.9, adult: Secondary | ICD-10-CM | POA: Diagnosis not present

## 2022-10-14 DIAGNOSIS — M545 Low back pain, unspecified: Secondary | ICD-10-CM | POA: Diagnosis not present

## 2022-10-14 DIAGNOSIS — I10 Essential (primary) hypertension: Secondary | ICD-10-CM | POA: Diagnosis not present

## 2022-10-27 ENCOUNTER — Other Ambulatory Visit: Payer: Self-pay | Admitting: Neurosurgery

## 2022-12-05 DIAGNOSIS — E785 Hyperlipidemia, unspecified: Secondary | ICD-10-CM | POA: Diagnosis not present

## 2022-12-05 DIAGNOSIS — E1169 Type 2 diabetes mellitus with other specified complication: Secondary | ICD-10-CM | POA: Diagnosis not present

## 2022-12-05 DIAGNOSIS — N289 Disorder of kidney and ureter, unspecified: Secondary | ICD-10-CM | POA: Diagnosis not present

## 2022-12-05 DIAGNOSIS — E7800 Pure hypercholesterolemia, unspecified: Secondary | ICD-10-CM | POA: Diagnosis not present

## 2022-12-06 DIAGNOSIS — Z6833 Body mass index (BMI) 33.0-33.9, adult: Secondary | ICD-10-CM | POA: Diagnosis not present

## 2022-12-06 DIAGNOSIS — N1831 Chronic kidney disease, stage 3a: Secondary | ICD-10-CM | POA: Diagnosis not present

## 2022-12-06 DIAGNOSIS — E7800 Pure hypercholesterolemia, unspecified: Secondary | ICD-10-CM | POA: Diagnosis not present

## 2022-12-06 DIAGNOSIS — K5909 Other constipation: Secondary | ICD-10-CM | POA: Diagnosis not present

## 2022-12-06 DIAGNOSIS — E6609 Other obesity due to excess calories: Secondary | ICD-10-CM | POA: Diagnosis not present

## 2022-12-06 DIAGNOSIS — I129 Hypertensive chronic kidney disease with stage 1 through stage 4 chronic kidney disease, or unspecified chronic kidney disease: Secondary | ICD-10-CM | POA: Diagnosis not present

## 2022-12-06 DIAGNOSIS — E1122 Type 2 diabetes mellitus with diabetic chronic kidney disease: Secondary | ICD-10-CM | POA: Diagnosis not present

## 2022-12-09 DIAGNOSIS — G8929 Other chronic pain: Secondary | ICD-10-CM | POA: Diagnosis not present

## 2022-12-09 DIAGNOSIS — R498 Other voice and resonance disorders: Secondary | ICD-10-CM | POA: Diagnosis not present

## 2022-12-09 DIAGNOSIS — M545 Low back pain, unspecified: Secondary | ICD-10-CM | POA: Diagnosis not present

## 2022-12-09 DIAGNOSIS — F411 Generalized anxiety disorder: Secondary | ICD-10-CM | POA: Diagnosis not present

## 2022-12-09 DIAGNOSIS — R5383 Other fatigue: Secondary | ICD-10-CM | POA: Diagnosis not present

## 2022-12-09 DIAGNOSIS — R2689 Other abnormalities of gait and mobility: Secondary | ICD-10-CM | POA: Diagnosis not present

## 2022-12-09 DIAGNOSIS — R262 Difficulty in walking, not elsewhere classified: Secondary | ICD-10-CM | POA: Diagnosis not present

## 2022-12-09 DIAGNOSIS — R490 Dysphonia: Secondary | ICD-10-CM | POA: Diagnosis not present

## 2022-12-09 DIAGNOSIS — R29898 Other symptoms and signs involving the musculoskeletal system: Secondary | ICD-10-CM | POA: Diagnosis not present

## 2022-12-17 ENCOUNTER — Ambulatory Visit: Payer: Medicare HMO | Admitting: Neurosurgery

## 2023-01-02 ENCOUNTER — Ambulatory Visit: Payer: Medicare HMO | Admitting: Neurosurgery

## 2023-01-07 ENCOUNTER — Telehealth: Payer: Self-pay | Admitting: Neurosurgery

## 2023-01-07 NOTE — Telephone Encounter (Signed)
Patient is calling to request a refill of Methocarbamol.   [facility] [address]

## 2023-01-07 NOTE — Telephone Encounter (Signed)
Patient aware to contact his PCP.

## 2023-01-07 NOTE — Telephone Encounter (Signed)
He should get further refills of robaxin from PCP. Please let him know.

## 2023-01-23 DIAGNOSIS — R29898 Other symptoms and signs involving the musculoskeletal system: Secondary | ICD-10-CM | POA: Diagnosis not present

## 2023-01-23 DIAGNOSIS — G8929 Other chronic pain: Secondary | ICD-10-CM | POA: Diagnosis not present

## 2023-01-23 DIAGNOSIS — R498 Other voice and resonance disorders: Secondary | ICD-10-CM | POA: Diagnosis not present

## 2023-01-23 DIAGNOSIS — R490 Dysphonia: Secondary | ICD-10-CM | POA: Diagnosis not present

## 2023-01-23 DIAGNOSIS — F411 Generalized anxiety disorder: Secondary | ICD-10-CM | POA: Diagnosis not present

## 2023-01-23 DIAGNOSIS — M545 Low back pain, unspecified: Secondary | ICD-10-CM | POA: Diagnosis not present

## 2023-01-23 DIAGNOSIS — G20C Parkinsonism, unspecified: Secondary | ICD-10-CM | POA: Diagnosis not present

## 2023-01-23 DIAGNOSIS — R2689 Other abnormalities of gait and mobility: Secondary | ICD-10-CM | POA: Diagnosis not present

## 2023-01-28 ENCOUNTER — Encounter: Payer: Self-pay | Admitting: Neurosurgery

## 2023-02-10 DIAGNOSIS — G479 Sleep disorder, unspecified: Secondary | ICD-10-CM | POA: Diagnosis not present

## 2023-02-10 DIAGNOSIS — F419 Anxiety disorder, unspecified: Secondary | ICD-10-CM | POA: Diagnosis not present

## 2023-02-10 DIAGNOSIS — K5909 Other constipation: Secondary | ICD-10-CM | POA: Diagnosis not present

## 2023-02-10 DIAGNOSIS — G20C Parkinsonism, unspecified: Secondary | ICD-10-CM | POA: Diagnosis not present

## 2023-02-10 DIAGNOSIS — I10 Essential (primary) hypertension: Secondary | ICD-10-CM | POA: Diagnosis not present

## 2023-02-10 DIAGNOSIS — E785 Hyperlipidemia, unspecified: Secondary | ICD-10-CM | POA: Diagnosis not present

## 2023-02-10 DIAGNOSIS — M545 Low back pain, unspecified: Secondary | ICD-10-CM | POA: Diagnosis not present

## 2023-02-10 DIAGNOSIS — K219 Gastro-esophageal reflux disease without esophagitis: Secondary | ICD-10-CM | POA: Diagnosis not present

## 2023-02-10 DIAGNOSIS — G629 Polyneuropathy, unspecified: Secondary | ICD-10-CM | POA: Diagnosis not present

## 2023-02-14 DIAGNOSIS — M545 Low back pain, unspecified: Secondary | ICD-10-CM | POA: Diagnosis not present

## 2023-02-14 DIAGNOSIS — G629 Polyneuropathy, unspecified: Secondary | ICD-10-CM | POA: Diagnosis not present

## 2023-02-14 DIAGNOSIS — E785 Hyperlipidemia, unspecified: Secondary | ICD-10-CM | POA: Diagnosis not present

## 2023-02-14 DIAGNOSIS — G479 Sleep disorder, unspecified: Secondary | ICD-10-CM | POA: Diagnosis not present

## 2023-02-14 DIAGNOSIS — I10 Essential (primary) hypertension: Secondary | ICD-10-CM | POA: Diagnosis not present

## 2023-02-14 DIAGNOSIS — G20C Parkinsonism, unspecified: Secondary | ICD-10-CM | POA: Diagnosis not present

## 2023-02-14 DIAGNOSIS — F419 Anxiety disorder, unspecified: Secondary | ICD-10-CM | POA: Diagnosis not present

## 2023-02-14 DIAGNOSIS — K5909 Other constipation: Secondary | ICD-10-CM | POA: Diagnosis not present

## 2023-02-14 DIAGNOSIS — K219 Gastro-esophageal reflux disease without esophagitis: Secondary | ICD-10-CM | POA: Diagnosis not present

## 2023-02-17 DIAGNOSIS — F419 Anxiety disorder, unspecified: Secondary | ICD-10-CM | POA: Diagnosis not present

## 2023-02-17 DIAGNOSIS — K5909 Other constipation: Secondary | ICD-10-CM | POA: Diagnosis not present

## 2023-02-17 DIAGNOSIS — G20C Parkinsonism, unspecified: Secondary | ICD-10-CM | POA: Diagnosis not present

## 2023-02-17 DIAGNOSIS — G479 Sleep disorder, unspecified: Secondary | ICD-10-CM | POA: Diagnosis not present

## 2023-02-17 DIAGNOSIS — G629 Polyneuropathy, unspecified: Secondary | ICD-10-CM | POA: Diagnosis not present

## 2023-02-17 DIAGNOSIS — M545 Low back pain, unspecified: Secondary | ICD-10-CM | POA: Diagnosis not present

## 2023-02-17 DIAGNOSIS — I10 Essential (primary) hypertension: Secondary | ICD-10-CM | POA: Diagnosis not present

## 2023-02-17 DIAGNOSIS — E785 Hyperlipidemia, unspecified: Secondary | ICD-10-CM | POA: Diagnosis not present

## 2023-02-17 DIAGNOSIS — K219 Gastro-esophageal reflux disease without esophagitis: Secondary | ICD-10-CM | POA: Diagnosis not present

## 2023-02-18 ENCOUNTER — Ambulatory Visit: Payer: Medicare HMO | Admitting: Neurosurgery

## 2023-02-18 ENCOUNTER — Encounter: Payer: Self-pay | Admitting: Neurosurgery

## 2023-02-18 VITALS — BP 110/76 | Ht 66.0 in | Wt 193.0 lb

## 2023-02-18 DIAGNOSIS — M48062 Spinal stenosis, lumbar region with neurogenic claudication: Secondary | ICD-10-CM

## 2023-02-18 DIAGNOSIS — Z09 Encounter for follow-up examination after completed treatment for conditions other than malignant neoplasm: Secondary | ICD-10-CM

## 2023-02-18 NOTE — Progress Notes (Signed)
   REFERRING PHYSICIAN:  Marisue Ivan, Md [address] [place],  [place] [address]  DOS: 06/19/22 L3-5 lumbar decompression including central laminectomy and bilateral medial facetectomies including foraminotomies   HISTORY OF PRESENT ILLNESS: Travis Santiago is status post L3-5 lumbar decompression including central laminectomy and bilateral medial facetectomies including foraminotomies .   Travis Santiago is doing very well.  His pain is much improved.  Travis Santiago is very careful with standing up.  Travis Santiago has some discomfort around his hips in the mornings, but his pain is much improved compared to before surgery.    PHYSICAL EXAMINATION:  General: Patient is well developed, well nourished, calm, collected, and in no apparent distress.   NEUROLOGICAL:  General: In no acute distress.   Awake, alert, oriented to person, place, and time.  Pupils equal round and reactive to light.  Facial tone is symmetric.     Strength:            Side Iliopsoas Quads Hamstring PF DF EHL  R 5 5 5 5 5 5   L 5 5 5 5 5 5    Incision c/d/i   ROS (Neurologic):  Negative except as noted above  IMAGING: Nothing new to review.   ASSESSMENT/PLAN:  Travis Santiago is doing well s/p above surgery.  I am very pleased with his recovery.  We will see him back on an as-needed basis.  If Travis Santiago continues to have issues with difficulty standing up, I have encouraged him to talk with his primary care doctor regarding whether Travis Santiago is orthostatic or not.   I spent a total of 10 minutes in this patient's care today. This time was spent reviewing pertinent records including imaging studies, obtaining and confirming history, performing a directed evaluation, formulating and discussing my recommendations, and documenting the visit within the medical record.    Venetia Night, MD  Department of neurosurgery

## 2023-02-24 DIAGNOSIS — I10 Essential (primary) hypertension: Secondary | ICD-10-CM | POA: Diagnosis not present

## 2023-02-24 DIAGNOSIS — G629 Polyneuropathy, unspecified: Secondary | ICD-10-CM | POA: Diagnosis not present

## 2023-02-24 DIAGNOSIS — G479 Sleep disorder, unspecified: Secondary | ICD-10-CM | POA: Diagnosis not present

## 2023-02-24 DIAGNOSIS — G20C Parkinsonism, unspecified: Secondary | ICD-10-CM | POA: Diagnosis not present

## 2023-02-24 DIAGNOSIS — K5909 Other constipation: Secondary | ICD-10-CM | POA: Diagnosis not present

## 2023-02-24 DIAGNOSIS — F419 Anxiety disorder, unspecified: Secondary | ICD-10-CM | POA: Diagnosis not present

## 2023-02-24 DIAGNOSIS — M545 Low back pain, unspecified: Secondary | ICD-10-CM | POA: Diagnosis not present

## 2023-02-24 DIAGNOSIS — E785 Hyperlipidemia, unspecified: Secondary | ICD-10-CM | POA: Diagnosis not present

## 2023-02-24 DIAGNOSIS — K219 Gastro-esophageal reflux disease without esophagitis: Secondary | ICD-10-CM | POA: Diagnosis not present

## 2023-02-26 DIAGNOSIS — E785 Hyperlipidemia, unspecified: Secondary | ICD-10-CM | POA: Diagnosis not present

## 2023-02-26 DIAGNOSIS — G479 Sleep disorder, unspecified: Secondary | ICD-10-CM | POA: Diagnosis not present

## 2023-02-26 DIAGNOSIS — G20C Parkinsonism, unspecified: Secondary | ICD-10-CM | POA: Diagnosis not present

## 2023-02-26 DIAGNOSIS — K5909 Other constipation: Secondary | ICD-10-CM | POA: Diagnosis not present

## 2023-02-26 DIAGNOSIS — I10 Essential (primary) hypertension: Secondary | ICD-10-CM | POA: Diagnosis not present

## 2023-02-26 DIAGNOSIS — M545 Low back pain, unspecified: Secondary | ICD-10-CM | POA: Diagnosis not present

## 2023-02-26 DIAGNOSIS — K219 Gastro-esophageal reflux disease without esophagitis: Secondary | ICD-10-CM | POA: Diagnosis not present

## 2023-02-27 DIAGNOSIS — F419 Anxiety disorder, unspecified: Secondary | ICD-10-CM | POA: Diagnosis not present

## 2023-02-27 DIAGNOSIS — G20C Parkinsonism, unspecified: Secondary | ICD-10-CM | POA: Diagnosis not present

## 2023-02-27 DIAGNOSIS — G479 Sleep disorder, unspecified: Secondary | ICD-10-CM | POA: Diagnosis not present

## 2023-02-27 DIAGNOSIS — G629 Polyneuropathy, unspecified: Secondary | ICD-10-CM | POA: Diagnosis not present

## 2023-02-27 DIAGNOSIS — E785 Hyperlipidemia, unspecified: Secondary | ICD-10-CM | POA: Diagnosis not present

## 2023-02-27 DIAGNOSIS — K219 Gastro-esophageal reflux disease without esophagitis: Secondary | ICD-10-CM | POA: Diagnosis not present

## 2023-02-27 DIAGNOSIS — M545 Low back pain, unspecified: Secondary | ICD-10-CM | POA: Diagnosis not present

## 2023-02-27 DIAGNOSIS — K5909 Other constipation: Secondary | ICD-10-CM | POA: Diagnosis not present

## 2023-02-27 DIAGNOSIS — I10 Essential (primary) hypertension: Secondary | ICD-10-CM | POA: Diagnosis not present

## 2023-03-06 DIAGNOSIS — K5909 Other constipation: Secondary | ICD-10-CM | POA: Diagnosis not present

## 2023-03-06 DIAGNOSIS — K219 Gastro-esophageal reflux disease without esophagitis: Secondary | ICD-10-CM | POA: Diagnosis not present

## 2023-03-06 DIAGNOSIS — I10 Essential (primary) hypertension: Secondary | ICD-10-CM | POA: Diagnosis not present

## 2023-03-06 DIAGNOSIS — M545 Low back pain, unspecified: Secondary | ICD-10-CM | POA: Diagnosis not present

## 2023-03-06 DIAGNOSIS — E785 Hyperlipidemia, unspecified: Secondary | ICD-10-CM | POA: Diagnosis not present

## 2023-03-06 DIAGNOSIS — G20C Parkinsonism, unspecified: Secondary | ICD-10-CM | POA: Diagnosis not present

## 2023-03-06 DIAGNOSIS — G629 Polyneuropathy, unspecified: Secondary | ICD-10-CM | POA: Diagnosis not present

## 2023-03-06 DIAGNOSIS — G479 Sleep disorder, unspecified: Secondary | ICD-10-CM | POA: Diagnosis not present

## 2023-03-06 DIAGNOSIS — F419 Anxiety disorder, unspecified: Secondary | ICD-10-CM | POA: Diagnosis not present

## 2023-03-12 DIAGNOSIS — K219 Gastro-esophageal reflux disease without esophagitis: Secondary | ICD-10-CM | POA: Diagnosis not present

## 2023-03-12 DIAGNOSIS — K5909 Other constipation: Secondary | ICD-10-CM | POA: Diagnosis not present

## 2023-03-12 DIAGNOSIS — I10 Essential (primary) hypertension: Secondary | ICD-10-CM | POA: Diagnosis not present

## 2023-03-12 DIAGNOSIS — E785 Hyperlipidemia, unspecified: Secondary | ICD-10-CM | POA: Diagnosis not present

## 2023-03-12 DIAGNOSIS — M545 Low back pain, unspecified: Secondary | ICD-10-CM | POA: Diagnosis not present

## 2023-03-12 DIAGNOSIS — G479 Sleep disorder, unspecified: Secondary | ICD-10-CM | POA: Diagnosis not present

## 2023-03-12 DIAGNOSIS — G20C Parkinsonism, unspecified: Secondary | ICD-10-CM | POA: Diagnosis not present

## 2023-03-12 DIAGNOSIS — F419 Anxiety disorder, unspecified: Secondary | ICD-10-CM | POA: Diagnosis not present

## 2023-03-12 DIAGNOSIS — G629 Polyneuropathy, unspecified: Secondary | ICD-10-CM | POA: Diagnosis not present

## 2023-03-13 DIAGNOSIS — I10 Essential (primary) hypertension: Secondary | ICD-10-CM | POA: Diagnosis not present

## 2023-03-13 DIAGNOSIS — M545 Low back pain, unspecified: Secondary | ICD-10-CM | POA: Diagnosis not present

## 2023-03-13 DIAGNOSIS — G20C Parkinsonism, unspecified: Secondary | ICD-10-CM | POA: Diagnosis not present

## 2023-03-13 DIAGNOSIS — G479 Sleep disorder, unspecified: Secondary | ICD-10-CM | POA: Diagnosis not present

## 2023-03-13 DIAGNOSIS — E785 Hyperlipidemia, unspecified: Secondary | ICD-10-CM | POA: Diagnosis not present

## 2023-03-13 DIAGNOSIS — K219 Gastro-esophageal reflux disease without esophagitis: Secondary | ICD-10-CM | POA: Diagnosis not present

## 2023-03-13 DIAGNOSIS — K5909 Other constipation: Secondary | ICD-10-CM | POA: Diagnosis not present

## 2023-03-13 DIAGNOSIS — F419 Anxiety disorder, unspecified: Secondary | ICD-10-CM | POA: Diagnosis not present

## 2023-03-13 DIAGNOSIS — G629 Polyneuropathy, unspecified: Secondary | ICD-10-CM | POA: Diagnosis not present

## 2023-03-21 DIAGNOSIS — I10 Essential (primary) hypertension: Secondary | ICD-10-CM | POA: Diagnosis not present

## 2023-03-21 DIAGNOSIS — E785 Hyperlipidemia, unspecified: Secondary | ICD-10-CM | POA: Diagnosis not present

## 2023-03-21 DIAGNOSIS — G20C Parkinsonism, unspecified: Secondary | ICD-10-CM | POA: Diagnosis not present

## 2023-03-21 DIAGNOSIS — M545 Low back pain, unspecified: Secondary | ICD-10-CM | POA: Diagnosis not present

## 2023-03-21 DIAGNOSIS — G629 Polyneuropathy, unspecified: Secondary | ICD-10-CM | POA: Diagnosis not present

## 2023-03-21 DIAGNOSIS — K219 Gastro-esophageal reflux disease without esophagitis: Secondary | ICD-10-CM | POA: Diagnosis not present

## 2023-03-21 DIAGNOSIS — F419 Anxiety disorder, unspecified: Secondary | ICD-10-CM | POA: Diagnosis not present

## 2023-03-21 DIAGNOSIS — G479 Sleep disorder, unspecified: Secondary | ICD-10-CM | POA: Diagnosis not present

## 2023-03-21 DIAGNOSIS — K5909 Other constipation: Secondary | ICD-10-CM | POA: Diagnosis not present

## 2023-03-28 ENCOUNTER — Telehealth: Payer: Self-pay | Admitting: Neurosurgery

## 2023-03-28 DIAGNOSIS — K5909 Other constipation: Secondary | ICD-10-CM | POA: Diagnosis not present

## 2023-03-28 DIAGNOSIS — E7800 Pure hypercholesterolemia, unspecified: Secondary | ICD-10-CM | POA: Diagnosis not present

## 2023-03-28 DIAGNOSIS — I10 Essential (primary) hypertension: Secondary | ICD-10-CM | POA: Diagnosis not present

## 2023-03-28 DIAGNOSIS — G629 Polyneuropathy, unspecified: Secondary | ICD-10-CM | POA: Diagnosis not present

## 2023-03-28 DIAGNOSIS — E1169 Type 2 diabetes mellitus with other specified complication: Secondary | ICD-10-CM | POA: Diagnosis not present

## 2023-03-28 DIAGNOSIS — F419 Anxiety disorder, unspecified: Secondary | ICD-10-CM | POA: Diagnosis not present

## 2023-03-28 DIAGNOSIS — N1831 Chronic kidney disease, stage 3a: Secondary | ICD-10-CM | POA: Diagnosis not present

## 2023-03-28 DIAGNOSIS — E785 Hyperlipidemia, unspecified: Secondary | ICD-10-CM | POA: Diagnosis not present

## 2023-03-28 DIAGNOSIS — G20C Parkinsonism, unspecified: Secondary | ICD-10-CM | POA: Diagnosis not present

## 2023-03-28 DIAGNOSIS — M545 Low back pain, unspecified: Secondary | ICD-10-CM | POA: Diagnosis not present

## 2023-03-28 DIAGNOSIS — G479 Sleep disorder, unspecified: Secondary | ICD-10-CM | POA: Diagnosis not present

## 2023-03-28 DIAGNOSIS — K219 Gastro-esophageal reflux disease without esophagitis: Secondary | ICD-10-CM | POA: Diagnosis not present

## 2023-03-28 NOTE — Telephone Encounter (Signed)
 Ok to fill out a new handicap placard form an dif so for how long? Last time we did one was in July 2024.

## 2023-03-28 NOTE — Telephone Encounter (Signed)
 Patient notified we can do it for 3 months. He requested we send it in the mail to him. I have sent it out. He will call PCP for additional placard if needed.

## 2023-03-28 NOTE — Telephone Encounter (Signed)
 Patient called stating he would like a new handicap placard filled out and mailed to his home address if possible

## 2023-03-28 NOTE — Telephone Encounter (Signed)
 We can do a handicapped form for 3 months, but he would need to get further handicapped placards from his PCP since we are not seeing him regularly.   Let me know if he wants one for 3 months.

## 2023-04-03 DIAGNOSIS — K219 Gastro-esophageal reflux disease without esophagitis: Secondary | ICD-10-CM | POA: Diagnosis not present

## 2023-04-03 DIAGNOSIS — K5909 Other constipation: Secondary | ICD-10-CM | POA: Diagnosis not present

## 2023-04-03 DIAGNOSIS — F419 Anxiety disorder, unspecified: Secondary | ICD-10-CM | POA: Diagnosis not present

## 2023-04-03 DIAGNOSIS — G20C Parkinsonism, unspecified: Secondary | ICD-10-CM | POA: Diagnosis not present

## 2023-04-03 DIAGNOSIS — E785 Hyperlipidemia, unspecified: Secondary | ICD-10-CM | POA: Diagnosis not present

## 2023-04-03 DIAGNOSIS — G479 Sleep disorder, unspecified: Secondary | ICD-10-CM | POA: Diagnosis not present

## 2023-04-03 DIAGNOSIS — M545 Low back pain, unspecified: Secondary | ICD-10-CM | POA: Diagnosis not present

## 2023-04-03 DIAGNOSIS — I10 Essential (primary) hypertension: Secondary | ICD-10-CM | POA: Diagnosis not present

## 2023-04-03 DIAGNOSIS — G629 Polyneuropathy, unspecified: Secondary | ICD-10-CM | POA: Diagnosis not present

## 2023-04-07 DIAGNOSIS — K5909 Other constipation: Secondary | ICD-10-CM | POA: Diagnosis not present

## 2023-04-07 DIAGNOSIS — K219 Gastro-esophageal reflux disease without esophagitis: Secondary | ICD-10-CM | POA: Diagnosis not present

## 2023-04-07 DIAGNOSIS — F419 Anxiety disorder, unspecified: Secondary | ICD-10-CM | POA: Diagnosis not present

## 2023-04-07 DIAGNOSIS — M545 Low back pain, unspecified: Secondary | ICD-10-CM | POA: Diagnosis not present

## 2023-04-07 DIAGNOSIS — G20C Parkinsonism, unspecified: Secondary | ICD-10-CM | POA: Diagnosis not present

## 2023-04-07 DIAGNOSIS — G629 Polyneuropathy, unspecified: Secondary | ICD-10-CM | POA: Diagnosis not present

## 2023-04-07 DIAGNOSIS — I10 Essential (primary) hypertension: Secondary | ICD-10-CM | POA: Diagnosis not present

## 2023-04-07 DIAGNOSIS — E785 Hyperlipidemia, unspecified: Secondary | ICD-10-CM | POA: Diagnosis not present

## 2023-04-07 DIAGNOSIS — G479 Sleep disorder, unspecified: Secondary | ICD-10-CM | POA: Diagnosis not present

## 2023-06-03 DIAGNOSIS — E1169 Type 2 diabetes mellitus with other specified complication: Secondary | ICD-10-CM | POA: Diagnosis not present

## 2023-06-03 DIAGNOSIS — I10 Essential (primary) hypertension: Secondary | ICD-10-CM | POA: Diagnosis not present

## 2023-06-03 DIAGNOSIS — E7800 Pure hypercholesterolemia, unspecified: Secondary | ICD-10-CM | POA: Diagnosis not present

## 2023-06-03 DIAGNOSIS — E785 Hyperlipidemia, unspecified: Secondary | ICD-10-CM | POA: Diagnosis not present

## 2023-06-03 DIAGNOSIS — N1831 Chronic kidney disease, stage 3a: Secondary | ICD-10-CM | POA: Diagnosis not present

## 2023-06-10 DIAGNOSIS — Z1331 Encounter for screening for depression: Secondary | ICD-10-CM | POA: Diagnosis not present

## 2023-06-10 DIAGNOSIS — I959 Hypotension, unspecified: Secondary | ICD-10-CM | POA: Diagnosis not present

## 2023-06-10 DIAGNOSIS — Z0000 Encounter for general adult medical examination without abnormal findings: Secondary | ICD-10-CM | POA: Diagnosis not present

## 2023-06-10 DIAGNOSIS — N189 Chronic kidney disease, unspecified: Secondary | ICD-10-CM | POA: Diagnosis not present

## 2023-06-10 DIAGNOSIS — G20A1 Parkinson's disease without dyskinesia, without mention of fluctuations: Secondary | ICD-10-CM | POA: Diagnosis not present

## 2023-06-10 DIAGNOSIS — E1122 Type 2 diabetes mellitus with diabetic chronic kidney disease: Secondary | ICD-10-CM | POA: Diagnosis not present

## 2023-06-10 DIAGNOSIS — Z599 Problem related to housing and economic circumstances, unspecified: Secondary | ICD-10-CM | POA: Diagnosis not present

## 2023-06-10 DIAGNOSIS — E7800 Pure hypercholesterolemia, unspecified: Secondary | ICD-10-CM | POA: Diagnosis not present

## 2023-06-21 DIAGNOSIS — H52223 Regular astigmatism, bilateral: Secondary | ICD-10-CM | POA: Diagnosis not present

## 2023-06-21 DIAGNOSIS — H5203 Hypermetropia, bilateral: Secondary | ICD-10-CM | POA: Diagnosis not present

## 2023-06-21 DIAGNOSIS — H524 Presbyopia: Secondary | ICD-10-CM | POA: Diagnosis not present

## 2023-06-24 DIAGNOSIS — R262 Difficulty in walking, not elsewhere classified: Secondary | ICD-10-CM | POA: Diagnosis not present

## 2023-06-24 DIAGNOSIS — M545 Low back pain, unspecified: Secondary | ICD-10-CM | POA: Diagnosis not present

## 2023-06-24 DIAGNOSIS — R490 Dysphonia: Secondary | ICD-10-CM | POA: Diagnosis not present

## 2023-06-24 DIAGNOSIS — R29898 Other symptoms and signs involving the musculoskeletal system: Secondary | ICD-10-CM | POA: Diagnosis not present

## 2023-06-24 DIAGNOSIS — G20C Parkinsonism, unspecified: Secondary | ICD-10-CM | POA: Diagnosis not present

## 2023-06-27 DIAGNOSIS — K5909 Other constipation: Secondary | ICD-10-CM | POA: Diagnosis not present

## 2023-06-27 DIAGNOSIS — G479 Sleep disorder, unspecified: Secondary | ICD-10-CM | POA: Diagnosis not present

## 2023-06-27 DIAGNOSIS — M545 Low back pain, unspecified: Secondary | ICD-10-CM | POA: Diagnosis not present

## 2023-06-27 DIAGNOSIS — E785 Hyperlipidemia, unspecified: Secondary | ICD-10-CM | POA: Diagnosis not present

## 2023-06-27 DIAGNOSIS — K219 Gastro-esophageal reflux disease without esophagitis: Secondary | ICD-10-CM | POA: Diagnosis not present

## 2023-06-27 DIAGNOSIS — F419 Anxiety disorder, unspecified: Secondary | ICD-10-CM | POA: Diagnosis not present

## 2023-06-27 DIAGNOSIS — I10 Essential (primary) hypertension: Secondary | ICD-10-CM | POA: Diagnosis not present

## 2023-06-27 DIAGNOSIS — E559 Vitamin D deficiency, unspecified: Secondary | ICD-10-CM | POA: Diagnosis not present

## 2023-06-27 DIAGNOSIS — G20A1 Parkinson's disease without dyskinesia, without mention of fluctuations: Secondary | ICD-10-CM | POA: Diagnosis not present

## 2023-06-30 DIAGNOSIS — I10 Essential (primary) hypertension: Secondary | ICD-10-CM | POA: Diagnosis not present

## 2023-06-30 DIAGNOSIS — K5909 Other constipation: Secondary | ICD-10-CM | POA: Diagnosis not present

## 2023-06-30 DIAGNOSIS — F419 Anxiety disorder, unspecified: Secondary | ICD-10-CM | POA: Diagnosis not present

## 2023-06-30 DIAGNOSIS — G20A1 Parkinson's disease without dyskinesia, without mention of fluctuations: Secondary | ICD-10-CM | POA: Diagnosis not present

## 2023-06-30 DIAGNOSIS — E785 Hyperlipidemia, unspecified: Secondary | ICD-10-CM | POA: Diagnosis not present

## 2023-06-30 DIAGNOSIS — E559 Vitamin D deficiency, unspecified: Secondary | ICD-10-CM | POA: Diagnosis not present

## 2023-06-30 DIAGNOSIS — M545 Low back pain, unspecified: Secondary | ICD-10-CM | POA: Diagnosis not present

## 2023-06-30 DIAGNOSIS — K219 Gastro-esophageal reflux disease without esophagitis: Secondary | ICD-10-CM | POA: Diagnosis not present

## 2023-06-30 DIAGNOSIS — G479 Sleep disorder, unspecified: Secondary | ICD-10-CM | POA: Diagnosis not present

## 2023-07-01 DIAGNOSIS — M545 Low back pain, unspecified: Secondary | ICD-10-CM | POA: Diagnosis not present

## 2023-07-01 DIAGNOSIS — E559 Vitamin D deficiency, unspecified: Secondary | ICD-10-CM | POA: Diagnosis not present

## 2023-07-01 DIAGNOSIS — G479 Sleep disorder, unspecified: Secondary | ICD-10-CM | POA: Diagnosis not present

## 2023-07-01 DIAGNOSIS — K5909 Other constipation: Secondary | ICD-10-CM | POA: Diagnosis not present

## 2023-07-01 DIAGNOSIS — G20A1 Parkinson's disease without dyskinesia, without mention of fluctuations: Secondary | ICD-10-CM | POA: Diagnosis not present

## 2023-07-01 DIAGNOSIS — F419 Anxiety disorder, unspecified: Secondary | ICD-10-CM | POA: Diagnosis not present

## 2023-07-01 DIAGNOSIS — K219 Gastro-esophageal reflux disease without esophagitis: Secondary | ICD-10-CM | POA: Diagnosis not present

## 2023-07-01 DIAGNOSIS — E785 Hyperlipidemia, unspecified: Secondary | ICD-10-CM | POA: Diagnosis not present

## 2023-07-01 DIAGNOSIS — I10 Essential (primary) hypertension: Secondary | ICD-10-CM | POA: Diagnosis not present

## 2023-07-03 DIAGNOSIS — I10 Essential (primary) hypertension: Secondary | ICD-10-CM | POA: Diagnosis not present

## 2023-07-03 DIAGNOSIS — K5909 Other constipation: Secondary | ICD-10-CM | POA: Diagnosis not present

## 2023-07-03 DIAGNOSIS — F419 Anxiety disorder, unspecified: Secondary | ICD-10-CM | POA: Diagnosis not present

## 2023-07-03 DIAGNOSIS — E559 Vitamin D deficiency, unspecified: Secondary | ICD-10-CM | POA: Diagnosis not present

## 2023-07-03 DIAGNOSIS — K219 Gastro-esophageal reflux disease without esophagitis: Secondary | ICD-10-CM | POA: Diagnosis not present

## 2023-07-03 DIAGNOSIS — G479 Sleep disorder, unspecified: Secondary | ICD-10-CM | POA: Diagnosis not present

## 2023-07-03 DIAGNOSIS — G20A1 Parkinson's disease without dyskinesia, without mention of fluctuations: Secondary | ICD-10-CM | POA: Diagnosis not present

## 2023-07-03 DIAGNOSIS — E785 Hyperlipidemia, unspecified: Secondary | ICD-10-CM | POA: Diagnosis not present

## 2023-07-03 DIAGNOSIS — M545 Low back pain, unspecified: Secondary | ICD-10-CM | POA: Diagnosis not present

## 2023-07-07 DIAGNOSIS — I10 Essential (primary) hypertension: Secondary | ICD-10-CM | POA: Diagnosis not present

## 2023-07-07 DIAGNOSIS — K5909 Other constipation: Secondary | ICD-10-CM | POA: Diagnosis not present

## 2023-07-07 DIAGNOSIS — G479 Sleep disorder, unspecified: Secondary | ICD-10-CM | POA: Diagnosis not present

## 2023-07-07 DIAGNOSIS — K219 Gastro-esophageal reflux disease without esophagitis: Secondary | ICD-10-CM | POA: Diagnosis not present

## 2023-07-07 DIAGNOSIS — E559 Vitamin D deficiency, unspecified: Secondary | ICD-10-CM | POA: Diagnosis not present

## 2023-07-07 DIAGNOSIS — M545 Low back pain, unspecified: Secondary | ICD-10-CM | POA: Diagnosis not present

## 2023-07-07 DIAGNOSIS — F419 Anxiety disorder, unspecified: Secondary | ICD-10-CM | POA: Diagnosis not present

## 2023-07-07 DIAGNOSIS — E785 Hyperlipidemia, unspecified: Secondary | ICD-10-CM | POA: Diagnosis not present

## 2023-07-07 DIAGNOSIS — G20A1 Parkinson's disease without dyskinesia, without mention of fluctuations: Secondary | ICD-10-CM | POA: Diagnosis not present

## 2023-07-08 DIAGNOSIS — K5909 Other constipation: Secondary | ICD-10-CM | POA: Diagnosis not present

## 2023-07-08 DIAGNOSIS — G20A1 Parkinson's disease without dyskinesia, without mention of fluctuations: Secondary | ICD-10-CM | POA: Diagnosis not present

## 2023-07-08 DIAGNOSIS — M545 Low back pain, unspecified: Secondary | ICD-10-CM | POA: Diagnosis not present

## 2023-07-08 DIAGNOSIS — I10 Essential (primary) hypertension: Secondary | ICD-10-CM | POA: Diagnosis not present

## 2023-07-08 DIAGNOSIS — E785 Hyperlipidemia, unspecified: Secondary | ICD-10-CM | POA: Diagnosis not present

## 2023-07-08 DIAGNOSIS — G479 Sleep disorder, unspecified: Secondary | ICD-10-CM | POA: Diagnosis not present

## 2023-07-08 DIAGNOSIS — E559 Vitamin D deficiency, unspecified: Secondary | ICD-10-CM | POA: Diagnosis not present

## 2023-07-08 DIAGNOSIS — K219 Gastro-esophageal reflux disease without esophagitis: Secondary | ICD-10-CM | POA: Diagnosis not present

## 2023-07-08 DIAGNOSIS — F419 Anxiety disorder, unspecified: Secondary | ICD-10-CM | POA: Diagnosis not present

## 2023-07-11 DIAGNOSIS — G479 Sleep disorder, unspecified: Secondary | ICD-10-CM | POA: Diagnosis not present

## 2023-07-11 DIAGNOSIS — E559 Vitamin D deficiency, unspecified: Secondary | ICD-10-CM | POA: Diagnosis not present

## 2023-07-11 DIAGNOSIS — E785 Hyperlipidemia, unspecified: Secondary | ICD-10-CM | POA: Diagnosis not present

## 2023-07-11 DIAGNOSIS — G20A1 Parkinson's disease without dyskinesia, without mention of fluctuations: Secondary | ICD-10-CM | POA: Diagnosis not present

## 2023-07-11 DIAGNOSIS — M545 Low back pain, unspecified: Secondary | ICD-10-CM | POA: Diagnosis not present

## 2023-07-11 DIAGNOSIS — K5909 Other constipation: Secondary | ICD-10-CM | POA: Diagnosis not present

## 2023-07-11 DIAGNOSIS — F419 Anxiety disorder, unspecified: Secondary | ICD-10-CM | POA: Diagnosis not present

## 2023-07-11 DIAGNOSIS — K219 Gastro-esophageal reflux disease without esophagitis: Secondary | ICD-10-CM | POA: Diagnosis not present

## 2023-07-11 DIAGNOSIS — I10 Essential (primary) hypertension: Secondary | ICD-10-CM | POA: Diagnosis not present

## 2023-07-14 DIAGNOSIS — G20A1 Parkinson's disease without dyskinesia, without mention of fluctuations: Secondary | ICD-10-CM | POA: Diagnosis not present

## 2023-07-14 DIAGNOSIS — N1831 Chronic kidney disease, stage 3a: Secondary | ICD-10-CM | POA: Diagnosis not present

## 2023-07-14 DIAGNOSIS — I10 Essential (primary) hypertension: Secondary | ICD-10-CM | POA: Diagnosis not present

## 2023-07-14 DIAGNOSIS — E7800 Pure hypercholesterolemia, unspecified: Secondary | ICD-10-CM | POA: Diagnosis not present

## 2023-07-14 DIAGNOSIS — M545 Low back pain, unspecified: Secondary | ICD-10-CM | POA: Diagnosis not present

## 2023-07-14 DIAGNOSIS — G479 Sleep disorder, unspecified: Secondary | ICD-10-CM | POA: Diagnosis not present

## 2023-07-14 DIAGNOSIS — F419 Anxiety disorder, unspecified: Secondary | ICD-10-CM | POA: Diagnosis not present

## 2023-07-14 DIAGNOSIS — E559 Vitamin D deficiency, unspecified: Secondary | ICD-10-CM | POA: Diagnosis not present

## 2023-07-14 DIAGNOSIS — K219 Gastro-esophageal reflux disease without esophagitis: Secondary | ICD-10-CM | POA: Diagnosis not present

## 2023-07-14 DIAGNOSIS — E785 Hyperlipidemia, unspecified: Secondary | ICD-10-CM | POA: Diagnosis not present

## 2023-07-14 DIAGNOSIS — E1169 Type 2 diabetes mellitus with other specified complication: Secondary | ICD-10-CM | POA: Diagnosis not present

## 2023-07-14 DIAGNOSIS — K5909 Other constipation: Secondary | ICD-10-CM | POA: Diagnosis not present

## 2023-07-16 DIAGNOSIS — G479 Sleep disorder, unspecified: Secondary | ICD-10-CM | POA: Diagnosis not present

## 2023-07-16 DIAGNOSIS — E785 Hyperlipidemia, unspecified: Secondary | ICD-10-CM | POA: Diagnosis not present

## 2023-07-16 DIAGNOSIS — G20A1 Parkinson's disease without dyskinesia, without mention of fluctuations: Secondary | ICD-10-CM | POA: Diagnosis not present

## 2023-07-16 DIAGNOSIS — M545 Low back pain, unspecified: Secondary | ICD-10-CM | POA: Diagnosis not present

## 2023-07-16 DIAGNOSIS — K219 Gastro-esophageal reflux disease without esophagitis: Secondary | ICD-10-CM | POA: Diagnosis not present

## 2023-07-16 DIAGNOSIS — E559 Vitamin D deficiency, unspecified: Secondary | ICD-10-CM | POA: Diagnosis not present

## 2023-07-16 DIAGNOSIS — I10 Essential (primary) hypertension: Secondary | ICD-10-CM | POA: Diagnosis not present

## 2023-07-16 DIAGNOSIS — F419 Anxiety disorder, unspecified: Secondary | ICD-10-CM | POA: Diagnosis not present

## 2023-07-16 DIAGNOSIS — K5909 Other constipation: Secondary | ICD-10-CM | POA: Diagnosis not present

## 2023-07-22 DIAGNOSIS — K5909 Other constipation: Secondary | ICD-10-CM | POA: Diagnosis not present

## 2023-07-22 DIAGNOSIS — M545 Low back pain, unspecified: Secondary | ICD-10-CM | POA: Diagnosis not present

## 2023-07-22 DIAGNOSIS — F419 Anxiety disorder, unspecified: Secondary | ICD-10-CM | POA: Diagnosis not present

## 2023-07-22 DIAGNOSIS — M5441 Lumbago with sciatica, right side: Secondary | ICD-10-CM | POA: Diagnosis not present

## 2023-07-22 DIAGNOSIS — I10 Essential (primary) hypertension: Secondary | ICD-10-CM | POA: Diagnosis not present

## 2023-07-22 DIAGNOSIS — G8929 Other chronic pain: Secondary | ICD-10-CM | POA: Diagnosis not present

## 2023-07-22 DIAGNOSIS — R3911 Hesitancy of micturition: Secondary | ICD-10-CM | POA: Diagnosis not present

## 2023-07-22 DIAGNOSIS — G479 Sleep disorder, unspecified: Secondary | ICD-10-CM | POA: Diagnosis not present

## 2023-07-22 DIAGNOSIS — K219 Gastro-esophageal reflux disease without esophagitis: Secondary | ICD-10-CM | POA: Diagnosis not present

## 2023-07-22 DIAGNOSIS — E559 Vitamin D deficiency, unspecified: Secondary | ICD-10-CM | POA: Diagnosis not present

## 2023-07-22 DIAGNOSIS — G20A1 Parkinson's disease without dyskinesia, without mention of fluctuations: Secondary | ICD-10-CM | POA: Diagnosis not present

## 2023-07-22 DIAGNOSIS — M5442 Lumbago with sciatica, left side: Secondary | ICD-10-CM | POA: Diagnosis not present

## 2023-07-22 DIAGNOSIS — R42 Dizziness and giddiness: Secondary | ICD-10-CM | POA: Diagnosis not present

## 2023-07-22 DIAGNOSIS — E785 Hyperlipidemia, unspecified: Secondary | ICD-10-CM | POA: Diagnosis not present

## 2023-07-24 DIAGNOSIS — M545 Low back pain, unspecified: Secondary | ICD-10-CM | POA: Diagnosis not present

## 2023-07-24 DIAGNOSIS — G20A1 Parkinson's disease without dyskinesia, without mention of fluctuations: Secondary | ICD-10-CM | POA: Diagnosis not present

## 2023-07-24 DIAGNOSIS — E785 Hyperlipidemia, unspecified: Secondary | ICD-10-CM | POA: Diagnosis not present

## 2023-07-24 DIAGNOSIS — E559 Vitamin D deficiency, unspecified: Secondary | ICD-10-CM | POA: Diagnosis not present

## 2023-07-24 DIAGNOSIS — K5909 Other constipation: Secondary | ICD-10-CM | POA: Diagnosis not present

## 2023-07-24 DIAGNOSIS — I10 Essential (primary) hypertension: Secondary | ICD-10-CM | POA: Diagnosis not present

## 2023-07-24 DIAGNOSIS — K219 Gastro-esophageal reflux disease without esophagitis: Secondary | ICD-10-CM | POA: Diagnosis not present

## 2023-07-24 DIAGNOSIS — F419 Anxiety disorder, unspecified: Secondary | ICD-10-CM | POA: Diagnosis not present

## 2023-07-24 DIAGNOSIS — G479 Sleep disorder, unspecified: Secondary | ICD-10-CM | POA: Diagnosis not present

## 2023-07-28 DIAGNOSIS — I10 Essential (primary) hypertension: Secondary | ICD-10-CM | POA: Diagnosis not present

## 2023-07-28 DIAGNOSIS — G20A1 Parkinson's disease without dyskinesia, without mention of fluctuations: Secondary | ICD-10-CM | POA: Diagnosis not present

## 2023-07-28 DIAGNOSIS — E785 Hyperlipidemia, unspecified: Secondary | ICD-10-CM | POA: Diagnosis not present

## 2023-07-28 DIAGNOSIS — M545 Low back pain, unspecified: Secondary | ICD-10-CM | POA: Diagnosis not present

## 2023-07-28 DIAGNOSIS — F419 Anxiety disorder, unspecified: Secondary | ICD-10-CM | POA: Diagnosis not present

## 2023-07-28 DIAGNOSIS — G479 Sleep disorder, unspecified: Secondary | ICD-10-CM | POA: Diagnosis not present

## 2023-07-28 DIAGNOSIS — E559 Vitamin D deficiency, unspecified: Secondary | ICD-10-CM | POA: Diagnosis not present

## 2023-07-28 DIAGNOSIS — K5909 Other constipation: Secondary | ICD-10-CM | POA: Diagnosis not present

## 2023-07-28 DIAGNOSIS — K219 Gastro-esophageal reflux disease without esophagitis: Secondary | ICD-10-CM | POA: Diagnosis not present

## 2023-07-31 DIAGNOSIS — F419 Anxiety disorder, unspecified: Secondary | ICD-10-CM | POA: Diagnosis not present

## 2023-07-31 DIAGNOSIS — K219 Gastro-esophageal reflux disease without esophagitis: Secondary | ICD-10-CM | POA: Diagnosis not present

## 2023-07-31 DIAGNOSIS — E785 Hyperlipidemia, unspecified: Secondary | ICD-10-CM | POA: Diagnosis not present

## 2023-07-31 DIAGNOSIS — M545 Low back pain, unspecified: Secondary | ICD-10-CM | POA: Diagnosis not present

## 2023-07-31 DIAGNOSIS — E559 Vitamin D deficiency, unspecified: Secondary | ICD-10-CM | POA: Diagnosis not present

## 2023-07-31 DIAGNOSIS — G20A1 Parkinson's disease without dyskinesia, without mention of fluctuations: Secondary | ICD-10-CM | POA: Diagnosis not present

## 2023-07-31 DIAGNOSIS — K5909 Other constipation: Secondary | ICD-10-CM | POA: Diagnosis not present

## 2023-07-31 DIAGNOSIS — I10 Essential (primary) hypertension: Secondary | ICD-10-CM | POA: Diagnosis not present

## 2023-07-31 DIAGNOSIS — G479 Sleep disorder, unspecified: Secondary | ICD-10-CM | POA: Diagnosis not present

## 2023-08-05 DIAGNOSIS — F419 Anxiety disorder, unspecified: Secondary | ICD-10-CM | POA: Diagnosis not present

## 2023-08-05 DIAGNOSIS — G20A1 Parkinson's disease without dyskinesia, without mention of fluctuations: Secondary | ICD-10-CM | POA: Diagnosis not present

## 2023-08-05 DIAGNOSIS — E559 Vitamin D deficiency, unspecified: Secondary | ICD-10-CM | POA: Diagnosis not present

## 2023-08-05 DIAGNOSIS — I10 Essential (primary) hypertension: Secondary | ICD-10-CM | POA: Diagnosis not present

## 2023-08-05 DIAGNOSIS — M545 Low back pain, unspecified: Secondary | ICD-10-CM | POA: Diagnosis not present

## 2023-08-05 DIAGNOSIS — E785 Hyperlipidemia, unspecified: Secondary | ICD-10-CM | POA: Diagnosis not present

## 2023-08-05 DIAGNOSIS — K5909 Other constipation: Secondary | ICD-10-CM | POA: Diagnosis not present

## 2023-08-05 DIAGNOSIS — K219 Gastro-esophageal reflux disease without esophagitis: Secondary | ICD-10-CM | POA: Diagnosis not present

## 2023-08-05 DIAGNOSIS — G479 Sleep disorder, unspecified: Secondary | ICD-10-CM | POA: Diagnosis not present

## 2023-08-06 DIAGNOSIS — E785 Hyperlipidemia, unspecified: Secondary | ICD-10-CM | POA: Diagnosis not present

## 2023-08-06 DIAGNOSIS — F419 Anxiety disorder, unspecified: Secondary | ICD-10-CM | POA: Diagnosis not present

## 2023-08-06 DIAGNOSIS — I10 Essential (primary) hypertension: Secondary | ICD-10-CM | POA: Diagnosis not present

## 2023-08-06 DIAGNOSIS — K219 Gastro-esophageal reflux disease without esophagitis: Secondary | ICD-10-CM | POA: Diagnosis not present

## 2023-08-06 DIAGNOSIS — E559 Vitamin D deficiency, unspecified: Secondary | ICD-10-CM | POA: Diagnosis not present

## 2023-08-06 DIAGNOSIS — G20A1 Parkinson's disease without dyskinesia, without mention of fluctuations: Secondary | ICD-10-CM | POA: Diagnosis not present

## 2023-08-06 DIAGNOSIS — K5909 Other constipation: Secondary | ICD-10-CM | POA: Diagnosis not present

## 2023-08-06 DIAGNOSIS — G479 Sleep disorder, unspecified: Secondary | ICD-10-CM | POA: Diagnosis not present

## 2023-08-06 DIAGNOSIS — M545 Low back pain, unspecified: Secondary | ICD-10-CM | POA: Diagnosis not present

## 2023-08-12 DIAGNOSIS — K5909 Other constipation: Secondary | ICD-10-CM | POA: Diagnosis not present

## 2023-08-12 DIAGNOSIS — I10 Essential (primary) hypertension: Secondary | ICD-10-CM | POA: Diagnosis not present

## 2023-08-12 DIAGNOSIS — K219 Gastro-esophageal reflux disease without esophagitis: Secondary | ICD-10-CM | POA: Diagnosis not present

## 2023-08-12 DIAGNOSIS — G479 Sleep disorder, unspecified: Secondary | ICD-10-CM | POA: Diagnosis not present

## 2023-08-12 DIAGNOSIS — M545 Low back pain, unspecified: Secondary | ICD-10-CM | POA: Diagnosis not present

## 2023-08-12 DIAGNOSIS — E785 Hyperlipidemia, unspecified: Secondary | ICD-10-CM | POA: Diagnosis not present

## 2023-08-12 DIAGNOSIS — E559 Vitamin D deficiency, unspecified: Secondary | ICD-10-CM | POA: Diagnosis not present

## 2023-08-12 DIAGNOSIS — F419 Anxiety disorder, unspecified: Secondary | ICD-10-CM | POA: Diagnosis not present

## 2023-08-12 DIAGNOSIS — G20A1 Parkinson's disease without dyskinesia, without mention of fluctuations: Secondary | ICD-10-CM | POA: Diagnosis not present

## 2023-08-13 DIAGNOSIS — I10 Essential (primary) hypertension: Secondary | ICD-10-CM | POA: Diagnosis not present

## 2023-08-13 DIAGNOSIS — F419 Anxiety disorder, unspecified: Secondary | ICD-10-CM | POA: Diagnosis not present

## 2023-08-13 DIAGNOSIS — E559 Vitamin D deficiency, unspecified: Secondary | ICD-10-CM | POA: Diagnosis not present

## 2023-08-13 DIAGNOSIS — G479 Sleep disorder, unspecified: Secondary | ICD-10-CM | POA: Diagnosis not present

## 2023-08-13 DIAGNOSIS — K219 Gastro-esophageal reflux disease without esophagitis: Secondary | ICD-10-CM | POA: Diagnosis not present

## 2023-08-13 DIAGNOSIS — M545 Low back pain, unspecified: Secondary | ICD-10-CM | POA: Diagnosis not present

## 2023-08-13 DIAGNOSIS — G20A1 Parkinson's disease without dyskinesia, without mention of fluctuations: Secondary | ICD-10-CM | POA: Diagnosis not present

## 2023-08-13 DIAGNOSIS — K5909 Other constipation: Secondary | ICD-10-CM | POA: Diagnosis not present

## 2023-08-13 DIAGNOSIS — E785 Hyperlipidemia, unspecified: Secondary | ICD-10-CM | POA: Diagnosis not present

## 2023-08-18 DIAGNOSIS — E559 Vitamin D deficiency, unspecified: Secondary | ICD-10-CM | POA: Diagnosis not present

## 2023-08-18 DIAGNOSIS — K5909 Other constipation: Secondary | ICD-10-CM | POA: Diagnosis not present

## 2023-08-18 DIAGNOSIS — I10 Essential (primary) hypertension: Secondary | ICD-10-CM | POA: Diagnosis not present

## 2023-08-18 DIAGNOSIS — G20A1 Parkinson's disease without dyskinesia, without mention of fluctuations: Secondary | ICD-10-CM | POA: Diagnosis not present

## 2023-08-18 DIAGNOSIS — M545 Low back pain, unspecified: Secondary | ICD-10-CM | POA: Diagnosis not present

## 2023-08-18 DIAGNOSIS — G479 Sleep disorder, unspecified: Secondary | ICD-10-CM | POA: Diagnosis not present

## 2023-08-18 DIAGNOSIS — K219 Gastro-esophageal reflux disease without esophagitis: Secondary | ICD-10-CM | POA: Diagnosis not present

## 2023-08-18 DIAGNOSIS — E785 Hyperlipidemia, unspecified: Secondary | ICD-10-CM | POA: Diagnosis not present

## 2023-08-18 DIAGNOSIS — F419 Anxiety disorder, unspecified: Secondary | ICD-10-CM | POA: Diagnosis not present

## 2023-08-21 DIAGNOSIS — E785 Hyperlipidemia, unspecified: Secondary | ICD-10-CM | POA: Diagnosis not present

## 2023-08-21 DIAGNOSIS — K219 Gastro-esophageal reflux disease without esophagitis: Secondary | ICD-10-CM | POA: Diagnosis not present

## 2023-08-21 DIAGNOSIS — E559 Vitamin D deficiency, unspecified: Secondary | ICD-10-CM | POA: Diagnosis not present

## 2023-08-21 DIAGNOSIS — M545 Low back pain, unspecified: Secondary | ICD-10-CM | POA: Diagnosis not present

## 2023-08-21 DIAGNOSIS — I10 Essential (primary) hypertension: Secondary | ICD-10-CM | POA: Diagnosis not present

## 2023-08-21 DIAGNOSIS — F419 Anxiety disorder, unspecified: Secondary | ICD-10-CM | POA: Diagnosis not present

## 2023-08-21 DIAGNOSIS — G20A1 Parkinson's disease without dyskinesia, without mention of fluctuations: Secondary | ICD-10-CM | POA: Diagnosis not present

## 2023-08-21 DIAGNOSIS — K5909 Other constipation: Secondary | ICD-10-CM | POA: Diagnosis not present

## 2023-08-21 DIAGNOSIS — G479 Sleep disorder, unspecified: Secondary | ICD-10-CM | POA: Diagnosis not present

## 2023-09-09 DIAGNOSIS — E785 Hyperlipidemia, unspecified: Secondary | ICD-10-CM | POA: Diagnosis not present

## 2023-09-09 DIAGNOSIS — E7800 Pure hypercholesterolemia, unspecified: Secondary | ICD-10-CM | POA: Diagnosis not present

## 2023-09-09 DIAGNOSIS — N1831 Chronic kidney disease, stage 3a: Secondary | ICD-10-CM | POA: Diagnosis not present

## 2023-09-09 DIAGNOSIS — E1169 Type 2 diabetes mellitus with other specified complication: Secondary | ICD-10-CM | POA: Diagnosis not present

## 2023-09-10 DIAGNOSIS — N401 Enlarged prostate with lower urinary tract symptoms: Secondary | ICD-10-CM | POA: Insufficient documentation

## 2023-09-10 DIAGNOSIS — R3912 Poor urinary stream: Secondary | ICD-10-CM | POA: Diagnosis not present

## 2023-09-10 DIAGNOSIS — E66811 Obesity, class 1: Secondary | ICD-10-CM | POA: Diagnosis not present

## 2023-09-10 DIAGNOSIS — E6609 Other obesity due to excess calories: Secondary | ICD-10-CM | POA: Diagnosis not present

## 2023-09-10 DIAGNOSIS — E1122 Type 2 diabetes mellitus with diabetic chronic kidney disease: Secondary | ICD-10-CM | POA: Diagnosis not present

## 2023-09-10 DIAGNOSIS — I129 Hypertensive chronic kidney disease with stage 1 through stage 4 chronic kidney disease, or unspecified chronic kidney disease: Secondary | ICD-10-CM | POA: Diagnosis not present

## 2023-09-10 DIAGNOSIS — N1831 Chronic kidney disease, stage 3a: Secondary | ICD-10-CM | POA: Diagnosis not present

## 2023-09-10 DIAGNOSIS — E7800 Pure hypercholesterolemia, unspecified: Secondary | ICD-10-CM | POA: Diagnosis not present

## 2023-09-10 DIAGNOSIS — Z6834 Body mass index (BMI) 34.0-34.9, adult: Secondary | ICD-10-CM | POA: Diagnosis not present

## 2023-10-14 DIAGNOSIS — F411 Generalized anxiety disorder: Secondary | ICD-10-CM | POA: Diagnosis not present

## 2023-10-14 DIAGNOSIS — R29898 Other symptoms and signs involving the musculoskeletal system: Secondary | ICD-10-CM | POA: Diagnosis not present

## 2023-10-14 DIAGNOSIS — R413 Other amnesia: Secondary | ICD-10-CM | POA: Diagnosis not present

## 2023-10-14 DIAGNOSIS — R262 Difficulty in walking, not elsewhere classified: Secondary | ICD-10-CM | POA: Diagnosis not present

## 2023-10-14 DIAGNOSIS — R498 Other voice and resonance disorders: Secondary | ICD-10-CM | POA: Diagnosis not present

## 2023-10-14 DIAGNOSIS — R490 Dysphonia: Secondary | ICD-10-CM | POA: Diagnosis not present

## 2023-10-14 DIAGNOSIS — G20A1 Parkinson's disease without dyskinesia, without mention of fluctuations: Secondary | ICD-10-CM | POA: Diagnosis not present

## 2023-10-14 DIAGNOSIS — R2689 Other abnormalities of gait and mobility: Secondary | ICD-10-CM | POA: Diagnosis not present

## 2023-10-14 DIAGNOSIS — M545 Low back pain, unspecified: Secondary | ICD-10-CM | POA: Diagnosis not present

## 2023-10-21 DIAGNOSIS — F419 Anxiety disorder, unspecified: Secondary | ICD-10-CM | POA: Diagnosis not present

## 2023-10-21 DIAGNOSIS — E785 Hyperlipidemia, unspecified: Secondary | ICD-10-CM | POA: Diagnosis not present

## 2023-10-21 DIAGNOSIS — G20A1 Parkinson's disease without dyskinesia, without mention of fluctuations: Secondary | ICD-10-CM | POA: Diagnosis not present

## 2023-10-21 DIAGNOSIS — E559 Vitamin D deficiency, unspecified: Secondary | ICD-10-CM | POA: Diagnosis not present

## 2023-10-21 DIAGNOSIS — R339 Retention of urine, unspecified: Secondary | ICD-10-CM | POA: Diagnosis not present

## 2023-10-21 DIAGNOSIS — M47816 Spondylosis without myelopathy or radiculopathy, lumbar region: Secondary | ICD-10-CM | POA: Diagnosis not present

## 2023-10-21 DIAGNOSIS — G479 Sleep disorder, unspecified: Secondary | ICD-10-CM | POA: Diagnosis not present

## 2023-10-21 DIAGNOSIS — K219 Gastro-esophageal reflux disease without esophagitis: Secondary | ICD-10-CM | POA: Diagnosis not present

## 2023-10-21 DIAGNOSIS — M48061 Spinal stenosis, lumbar region without neurogenic claudication: Secondary | ICD-10-CM | POA: Diagnosis not present

## 2023-10-23 DIAGNOSIS — E559 Vitamin D deficiency, unspecified: Secondary | ICD-10-CM | POA: Diagnosis not present

## 2023-10-23 DIAGNOSIS — F419 Anxiety disorder, unspecified: Secondary | ICD-10-CM | POA: Diagnosis not present

## 2023-10-23 DIAGNOSIS — M48061 Spinal stenosis, lumbar region without neurogenic claudication: Secondary | ICD-10-CM | POA: Diagnosis not present

## 2023-10-23 DIAGNOSIS — M47816 Spondylosis without myelopathy or radiculopathy, lumbar region: Secondary | ICD-10-CM | POA: Diagnosis not present

## 2023-10-23 DIAGNOSIS — G479 Sleep disorder, unspecified: Secondary | ICD-10-CM | POA: Diagnosis not present

## 2023-10-23 DIAGNOSIS — K219 Gastro-esophageal reflux disease without esophagitis: Secondary | ICD-10-CM | POA: Diagnosis not present

## 2023-10-23 DIAGNOSIS — E785 Hyperlipidemia, unspecified: Secondary | ICD-10-CM | POA: Diagnosis not present

## 2023-10-23 DIAGNOSIS — R339 Retention of urine, unspecified: Secondary | ICD-10-CM | POA: Diagnosis not present

## 2023-10-23 DIAGNOSIS — G20A1 Parkinson's disease without dyskinesia, without mention of fluctuations: Secondary | ICD-10-CM | POA: Diagnosis not present

## 2023-10-28 DIAGNOSIS — E559 Vitamin D deficiency, unspecified: Secondary | ICD-10-CM | POA: Diagnosis not present

## 2023-10-28 DIAGNOSIS — M48061 Spinal stenosis, lumbar region without neurogenic claudication: Secondary | ICD-10-CM | POA: Diagnosis not present

## 2023-10-28 DIAGNOSIS — E785 Hyperlipidemia, unspecified: Secondary | ICD-10-CM | POA: Diagnosis not present

## 2023-10-28 DIAGNOSIS — K219 Gastro-esophageal reflux disease without esophagitis: Secondary | ICD-10-CM | POA: Diagnosis not present

## 2023-10-28 DIAGNOSIS — R339 Retention of urine, unspecified: Secondary | ICD-10-CM | POA: Diagnosis not present

## 2023-10-28 DIAGNOSIS — G20A1 Parkinson's disease without dyskinesia, without mention of fluctuations: Secondary | ICD-10-CM | POA: Diagnosis not present

## 2023-10-28 DIAGNOSIS — M47816 Spondylosis without myelopathy or radiculopathy, lumbar region: Secondary | ICD-10-CM | POA: Diagnosis not present

## 2023-10-28 DIAGNOSIS — F419 Anxiety disorder, unspecified: Secondary | ICD-10-CM | POA: Diagnosis not present

## 2023-10-28 DIAGNOSIS — G479 Sleep disorder, unspecified: Secondary | ICD-10-CM | POA: Diagnosis not present

## 2023-10-30 DIAGNOSIS — E559 Vitamin D deficiency, unspecified: Secondary | ICD-10-CM | POA: Diagnosis not present

## 2023-10-30 DIAGNOSIS — F419 Anxiety disorder, unspecified: Secondary | ICD-10-CM | POA: Diagnosis not present

## 2023-10-30 DIAGNOSIS — G479 Sleep disorder, unspecified: Secondary | ICD-10-CM | POA: Diagnosis not present

## 2023-10-30 DIAGNOSIS — E785 Hyperlipidemia, unspecified: Secondary | ICD-10-CM | POA: Diagnosis not present

## 2023-10-30 DIAGNOSIS — M48061 Spinal stenosis, lumbar region without neurogenic claudication: Secondary | ICD-10-CM | POA: Diagnosis not present

## 2023-10-30 DIAGNOSIS — R339 Retention of urine, unspecified: Secondary | ICD-10-CM | POA: Diagnosis not present

## 2023-10-30 DIAGNOSIS — M47816 Spondylosis without myelopathy or radiculopathy, lumbar region: Secondary | ICD-10-CM | POA: Diagnosis not present

## 2023-10-30 DIAGNOSIS — G20A1 Parkinson's disease without dyskinesia, without mention of fluctuations: Secondary | ICD-10-CM | POA: Diagnosis not present

## 2023-10-30 DIAGNOSIS — K219 Gastro-esophageal reflux disease without esophagitis: Secondary | ICD-10-CM | POA: Diagnosis not present

## 2023-10-30 DIAGNOSIS — Z85038 Personal history of other malignant neoplasm of large intestine: Secondary | ICD-10-CM | POA: Diagnosis not present

## 2023-10-30 DIAGNOSIS — K5909 Other constipation: Secondary | ICD-10-CM | POA: Diagnosis not present

## 2023-11-20 DIAGNOSIS — G20A1 Parkinson's disease without dyskinesia, without mention of fluctuations: Secondary | ICD-10-CM | POA: Diagnosis not present

## 2023-11-20 DIAGNOSIS — F419 Anxiety disorder, unspecified: Secondary | ICD-10-CM | POA: Diagnosis not present

## 2023-11-20 DIAGNOSIS — R339 Retention of urine, unspecified: Secondary | ICD-10-CM | POA: Diagnosis not present

## 2023-11-20 DIAGNOSIS — M47816 Spondylosis without myelopathy or radiculopathy, lumbar region: Secondary | ICD-10-CM | POA: Diagnosis not present

## 2023-11-20 DIAGNOSIS — M48061 Spinal stenosis, lumbar region without neurogenic claudication: Secondary | ICD-10-CM | POA: Diagnosis not present

## 2023-11-20 DIAGNOSIS — K219 Gastro-esophageal reflux disease without esophagitis: Secondary | ICD-10-CM | POA: Diagnosis not present

## 2023-11-20 DIAGNOSIS — G479 Sleep disorder, unspecified: Secondary | ICD-10-CM | POA: Diagnosis not present

## 2023-12-01 ENCOUNTER — Encounter: Payer: Self-pay | Admitting: *Deleted

## 2023-12-09 ENCOUNTER — Ambulatory Visit: Admission: RE | Admit: 2023-12-09 | Source: Home / Self Care

## 2023-12-09 ENCOUNTER — Encounter: Admission: RE | Payer: Self-pay | Source: Home / Self Care

## 2023-12-09 DIAGNOSIS — E7800 Pure hypercholesterolemia, unspecified: Secondary | ICD-10-CM | POA: Diagnosis not present

## 2023-12-09 DIAGNOSIS — E785 Hyperlipidemia, unspecified: Secondary | ICD-10-CM | POA: Diagnosis not present

## 2023-12-09 DIAGNOSIS — I10 Essential (primary) hypertension: Secondary | ICD-10-CM | POA: Diagnosis not present

## 2023-12-09 DIAGNOSIS — E1169 Type 2 diabetes mellitus with other specified complication: Secondary | ICD-10-CM | POA: Diagnosis not present

## 2023-12-09 HISTORY — DX: Sleep apnea, unspecified: G47.30

## 2023-12-09 HISTORY — DX: Localized swelling, mass and lump, neck: R22.1

## 2023-12-09 SURGERY — COLONOSCOPY
Anesthesia: General

## 2023-12-11 DIAGNOSIS — E7800 Pure hypercholesterolemia, unspecified: Secondary | ICD-10-CM | POA: Diagnosis not present

## 2023-12-11 DIAGNOSIS — M48062 Spinal stenosis, lumbar region with neurogenic claudication: Secondary | ICD-10-CM | POA: Diagnosis not present

## 2023-12-11 DIAGNOSIS — N401 Enlarged prostate with lower urinary tract symptoms: Secondary | ICD-10-CM | POA: Diagnosis not present

## 2023-12-11 DIAGNOSIS — I10 Essential (primary) hypertension: Secondary | ICD-10-CM | POA: Diagnosis not present

## 2023-12-11 DIAGNOSIS — E119 Type 2 diabetes mellitus without complications: Secondary | ICD-10-CM | POA: Diagnosis not present

## 2023-12-11 DIAGNOSIS — R3912 Poor urinary stream: Secondary | ICD-10-CM | POA: Diagnosis not present

## 2023-12-24 ENCOUNTER — Telehealth: Payer: Self-pay | Admitting: Neurosurgery

## 2023-12-24 NOTE — Telephone Encounter (Signed)
 Nat spoke with the patient and patient understood and will discuss placard at his upcoming appointment

## 2023-12-24 NOTE — Telephone Encounter (Signed)
 Patient is needing to get the handicap placard paperwork filled out.  As well as a lift chair as well, do we send out approvals for that?

## 2023-12-24 NOTE — Telephone Encounter (Signed)
 He has not been seen since last November.   We can discuss handicapped placard at upcoming appointment, but if he wants a permanent one then he should talk to his PCP. I only do temporary ones.   He should talk to PCP or neurology about a lift chair.

## 2023-12-31 NOTE — Progress Notes (Deleted)
 Referring Physician:  Alla Amis, MD [phone] [facility] [address] [facility] [place],  [place] [address]  Primary Physician:  Alla Amis, MD  History of Present Illness: 12/31/2023*** Travis Santiago has a history of DM, hyperlipidemia, BPH, HTN, history of colon CA, early parkinsons, GERD.   He is s/p L3-L5 decompression by Dr. Clois on 06/19/22. Was doing well at his last visit on 02/18/23.       PCP recently increased his lyrica to 50mg  in am and 25mg  q hs. He was also given ultram and advised to take OTC tylenol .   Duration: *** Location: *** Quality: *** Severity: ***  Precipitating: aggravated by *** Modifying factors: made better by *** Weakness: none Timing: ***  Tobacco use: Does not smoke.   Bowel/Bladder Dysfunction: none  Conservative measures:  Physical therapy: ***  Multimodal medical therapy including regular antiinflammatories: ***  Injections: *** epidural steroid injections  Past Surgery:  06/19/22  Lumbar decompression L3-L5 by Dr. Clois Carlin JAYSON Pouch has ***no symptoms of cervical myelopathy.  The symptoms are causing a significant impact on the patient's life.   Review of Systems:  A 10 point review of systems is negative, except for the pertinent positives and negatives detailed in the HPI.  Past Medical History: Past Medical History:  Diagnosis Date   Anxiety    Arthritis    Chronic lower back pain    Colon cancer (HCC) 01/2005   a.) s/p resection + adjuvant chemotherapy   Depression    Diastolic dysfunction    a.) TTE 06/14/2022: EF >55%, mild LVH, triv TR, mild MR/PR, G1DD   GERD (gastroesophageal reflux disease)    Hyperlipidemia    Hypertension    Mass of right side of neck    Pre-diabetes    Sleep apnea    Spinal stenosis of lumbar region with neurogenic claudication    Vitamin D deficiency     Past Surgical History: Past Surgical History:  Procedure Laterality Date   COLECTOMY   01/2005   COLONOSCOPY  2006   COLONOSCOPY  10/01/2016   LUMBAR LAMINECTOMY/DECOMPRESSION MICRODISCECTOMY N/A 06/19/2022   Procedure: L3-5 DECOMPRESSION;  Surgeon: Clois Fret, MD;  Location: [facility] ORS;  Service: Neurosurgery;  Laterality: N/A;   PORT-A-CATH REMOVAL  08/2005   PORTACATH PLACEMENT  03/2005    Allergies: Allergies as of 01/05/2024 - Review Complete 12/01/2023  Allergen Reaction Noted   Other Anaphylaxis 07/16/2016    Medications: Outpatient Encounter Medications as of 01/05/2024  Medication Sig   acetaminophen  (TYLENOL ) 500 MG tablet Take 500 mg by mouth every 6 (six) hours as needed.   atorvastatin (LIPITOR) 40 MG tablet Take 1 tablet by mouth at bedtime.   gabapentin (NEURONTIN) 300 MG capsule Take 300 mg by mouth at bedtime.   LINZESS 72 MCG capsule Take 72 mcg by mouth daily.   losartan (COZAAR) 100 MG tablet Take 1 tablet by mouth at bedtime.   methocarbamol  (ROBAXIN ) 500 MG tablet TAKE 1 TABLET BY MOUTH FOUR TIMES A DAY   oxyCODONE  (ROXICODONE ) 5 MG immediate release tablet Take 1 tablet (5 mg total) by mouth every 12 (twelve) hours as needed for severe pain.   sertraline (ZOLOFT) 25 MG tablet Take 25 mg by mouth at bedtime.   No facility-administered encounter medications on file as of 01/05/2024.    Social History: Social History   Tobacco Use   Smoking status: Former    Current packs/day: 0.00    Types: Cigarettes    Quit  date: 07/30/1973    Years since quitting: 50.4   Smokeless tobacco: Never  Vaping Use   Vaping status: Never Used  Substance Use Topics   Alcohol use: Not Currently    Alcohol/week: 1.0 standard drink of alcohol    Types: 1 Glasses of wine per week    Comment: rarely   Drug use: Never    Family Medical History: Family History  Problem Relation Age of Onset   Gastric cancer Father    Prostate cancer Brother     Physical Examination: There were no vitals filed for this visit.  General: Patient is well developed,  well nourished, calm, collected, and in no apparent distress. Attention to examination is appropriate.  Respiratory: Patient is breathing without any difficulty.   NEUROLOGICAL:     Awake, alert, oriented to person, place, and time.  Speech is clear and fluent. Fund of knowledge is appropriate.   Cranial Nerves: Pupils equal round and reactive to light.  Facial tone is symmetric.    *** ROM of cervical spine *** pain *** posterior cervical tenderness. *** tenderness in bilateral trapezial region.   *** ROM of lumbar spine *** pain *** posterior lumbar tenderness.   No abnormal lesions on exposed skin.   Strength: Side Biceps Triceps Deltoid Interossei Grip Wrist Ext. Wrist Flex.  R 5 5 5 5 5 5 5   L 5 5 5 5 5 5 5    Side Iliopsoas Quads Hamstring PF DF EHL  R 5 5 5 5 5 5   L 5 5 5 5 5 5    Reflexes are ***2+ and symmetric at the biceps, brachioradialis, patella and achilles.   Hoffman's is absent.  Clonus is not present.   Bilateral upper and lower extremity sensation is intact to light touch.     Gait is normal.   ***No difficulty with tandem gait.    Medical Decision Making  Imaging: Lumbar xrays dated 12/11/23:  FINDINGS:  Regional soft tissues unremarkable.  No aggressive osseus lesion.  Vertebral body heights are intact.  Small multilevel anterior osteophytes with minimal associated disc height  loss.  Moderate facet hypertrophy on the right at L4-5.   Impression  1.  Vertebral body heights are intact without evidence of acute fracture. 2.  Mild multilevel degenerative disc disease in the lumbar spine. 3.  Moderate facet hypertrophy on the right at L4-5.  Travis CHRISTOPHER CLINE, MD   I have personally reviewed the images and agree with the above interpretation.***  Assessment and Plan: [name] is a pleasant 73 y.o. male has ***  Treatment options discussed with patient and following plan made:   - Order for physical therapy for *** spine ***.  Patient to call to schedule appointment. *** - Continue current medications including ***. Reviewed dosing and side effects.  - Prescription for ***. Reviewed dosing and side effects. Take with food.  - Prescription for *** to take prn muscle spasms. Reviewed dosing and side effects. Discussed this can cause drowsiness.  - MRI of *** to further evaluate *** radiculopathy. No improvement time or medications (***).  - Referral to PMR at [facility] to discuss possible *** injections.  - Will schedule phone visit to review MRI results once I get them back.   I spent a total of *** minutes in face-to-face and non-face-to-face activities related to this patient's care today including review of outside records, review of imaging, review of symptoms, physical exam, discussion of differential diagnosis, discussion of treatment options, and documentation.  Thank you for involving me in the care of this patient.   Glade Boys PA-C Dept. of Neurosurgery

## 2024-01-01 ENCOUNTER — Inpatient Hospital Stay
Admission: RE | Admit: 2024-01-01 | Discharge: 2024-01-01 | Disposition: A | Discharge status: Home / Self Care | Payer: Self-pay | Source: Ambulatory Visit | Attending: Orthopedic Surgery | Admitting: Orthopedic Surgery

## 2024-01-01 ENCOUNTER — Other Ambulatory Visit: Payer: Self-pay | Admitting: Family Medicine

## 2024-01-01 DIAGNOSIS — Z049 Encounter for examination and observation for unspecified reason: Secondary | ICD-10-CM

## 2024-01-05 ENCOUNTER — Ambulatory Visit: Admitting: Orthopedic Surgery

## 2024-01-06 DIAGNOSIS — R5383 Other fatigue: Secondary | ICD-10-CM | POA: Diagnosis not present

## 2024-01-06 DIAGNOSIS — R498 Other voice and resonance disorders: Secondary | ICD-10-CM | POA: Diagnosis not present

## 2024-01-06 DIAGNOSIS — R29898 Other symptoms and signs involving the musculoskeletal system: Secondary | ICD-10-CM | POA: Diagnosis not present

## 2024-01-06 DIAGNOSIS — R413 Other amnesia: Secondary | ICD-10-CM | POA: Diagnosis not present

## 2024-01-06 DIAGNOSIS — M545 Low back pain, unspecified: Secondary | ICD-10-CM | POA: Diagnosis not present

## 2024-01-06 DIAGNOSIS — G20C Parkinsonism, unspecified: Secondary | ICD-10-CM | POA: Diagnosis not present

## 2024-01-06 DIAGNOSIS — R262 Difficulty in walking, not elsewhere classified: Secondary | ICD-10-CM | POA: Diagnosis not present

## 2024-01-06 DIAGNOSIS — R2689 Other abnormalities of gait and mobility: Secondary | ICD-10-CM | POA: Diagnosis not present

## 2024-01-06 DIAGNOSIS — F411 Generalized anxiety disorder: Secondary | ICD-10-CM | POA: Diagnosis not present

## 2024-01-27 DIAGNOSIS — E7800 Pure hypercholesterolemia, unspecified: Secondary | ICD-10-CM | POA: Diagnosis not present

## 2024-01-27 DIAGNOSIS — E119 Type 2 diabetes mellitus without complications: Secondary | ICD-10-CM | POA: Diagnosis not present

## 2024-02-03 DIAGNOSIS — M48061 Spinal stenosis, lumbar region without neurogenic claudication: Secondary | ICD-10-CM | POA: Diagnosis not present

## 2024-02-03 DIAGNOSIS — Z0289 Encounter for other administrative examinations: Secondary | ICD-10-CM | POA: Diagnosis not present

## 2024-02-11 NOTE — Progress Notes (Unsigned)
 Referring Physician:  Alla Amis, MD [phone] [facility] [address] [facility] [place],  [place] [address]  Primary Physician:  Alla Amis, MD  History of Present Illness: 02/12/2024 Travis Santiago has a history of colon CA, DM, HTN, early parkinson's, hypercholesterolemia, obesity, BPH, GERD, hyperlipidemia, CKD.   He is s/p lumbar decompression L3-L5 by Dr. Clois on 06/19/22.   He was doing very well at his last visit on 02/18/23 and his pain was much better.   He has intermittent LBP with intermittent bilateral leg pain (entire leg) to his ankles x months. LBP = leg pain, right leg pain > left leg pain. He has intermittent numbness and tingling in his legs with weakness. Pain is worse with standing and walking. Some relief with grocery cart. No pain with sitting. He feels like he is dragging his feet at times. He's noted 4-5 falls in last year. He has been using a cane for 2 months.   He has pain contract with PCP. He is on ultram and neurology has him on lyrica. He is also taking prn tylenol .   Tobacco use: Does not smoke.   Bowel/Bladder Dysfunction: none  Conservative measures:  Physical therapy: has not participated in  Multimodal medical therapy including regular antiinflammatories: tylenol , neurontin, lyrica, robaxin , oxycodone , ultram.  Injections:  no recent epidural steroid injections  Past Surgery: 06/19/2022 LUMBAR LAMINECTOMY/DECOMPRESSION MICRODISCECTOMY   The symptoms are causing a significant impact on the patient's life.   Review of Systems:  A 10 point review of systems is negative, except for the pertinent positives and negatives detailed in the HPI.  Past Medical History: Past Medical History:  Diagnosis Date   Anxiety    Arthritis    Chronic lower back pain    Colon cancer (HCC) 01/2005   a.) s/p resection + adjuvant chemotherapy   Depression    Diastolic dysfunction    a.) TTE 06/14/2022: EF >55%, mild LVH, triv TR, mild  MR/PR, G1DD   GERD (gastroesophageal reflux disease)    Hyperlipidemia    Hypertension    Mass of right side of neck    Pre-diabetes    Sleep apnea    Spinal stenosis of lumbar region with neurogenic claudication    Vitamin D deficiency     Past Surgical History: Past Surgical History:  Procedure Laterality Date   COLECTOMY  01/2005   COLONOSCOPY  2006   COLONOSCOPY  10/01/2016   LUMBAR LAMINECTOMY/DECOMPRESSION MICRODISCECTOMY N/A 06/19/2022   Procedure: L3-5 DECOMPRESSION;  Surgeon: Clois Fret, MD;  Location: [facility] ORS;  Service: Neurosurgery;  Laterality: N/A;   PORT-A-CATH REMOVAL  08/2005   PORTACATH PLACEMENT  03/2005    Allergies: Allergies as of 02/12/2024 - Review Complete 02/12/2024  Allergen Reaction Noted   Other Anaphylaxis 07/16/2016    Medications: Outpatient Encounter Medications as of 02/12/2024  Medication Sig   acetaminophen  (TYLENOL ) 500 MG tablet Take 500 mg by mouth every 6 (six) hours as needed.   atorvastatin (LIPITOR) 40 MG tablet Take 1 tablet by mouth at bedtime.   losartan (COZAAR) 100 MG tablet Take 1 tablet by mouth at bedtime.   pregabalin (LYRICA) 25 MG capsule Take 25 mg by mouth at bedtime.   sertraline (ZOLOFT) 25 MG tablet Take 25 mg by mouth at bedtime.   tamsulosin (FLOMAX) 0.4 MG CAPS capsule Take 0.4 mg by mouth daily.   traMADol (ULTRAM) 50 MG tablet Take 50 mg by mouth every 8 (eight) hours as needed.   [DISCONTINUED] gabapentin (  NEURONTIN) 300 MG capsule Take 300 mg by mouth at bedtime.   [DISCONTINUED] LINZESS 72 MCG capsule Take 72 mcg by mouth daily.   [DISCONTINUED] methocarbamol  (ROBAXIN ) 500 MG tablet TAKE 1 TABLET BY MOUTH FOUR TIMES A DAY   [DISCONTINUED] oxyCODONE  (ROXICODONE ) 5 MG immediate release tablet Take 1 tablet (5 mg total) by mouth every 12 (twelve) hours as needed for severe pain.   No facility-administered encounter medications on file as of 02/12/2024.    Social History: Social History   Tobacco  Use   Smoking status: Former    Current packs/day: 0.00    Types: Cigarettes    Quit date: 07/30/1973    Years since quitting: 50.5   Smokeless tobacco: Never  Vaping Use   Vaping status: Never Used  Substance Use Topics   Alcohol use: Not Currently    Alcohol/week: 1.0 standard drink of alcohol    Types: 1 Glasses of wine per week    Comment: rarely   Drug use: Never    Family Medical History: Family History  Problem Relation Age of Onset   Gastric cancer Father    Prostate cancer Brother     Physical Examination: Vitals:   02/12/24 0832  BP: 108/76      Awake, alert, oriented to person, place, and time.  Speech is clear and fluent. Fund of knowledge is appropriate.   Cranial Nerves: Pupils equal round and reactive to light.  Facial tone is symmetric.    Well healed lumbar incision.  No abnormal lesions on exposed skin.   Strength: Side Iliopsoas Quads Hamstring PF DF EHL  R 5 5 5 5 5 5   L 5 5 5 5 5 5    Reflexes are 1+ and symmetric at the biceps, brachioradialis, patella and achilles.   Hoffman's is absent.  Clonus is not present.   Bilateral lower extremity sensation is intact to light touch.     He has very slow gait and ambulates with cane.   Medical Decision Making  Imaging: Lumbar xrays dated 12/11/23:  FINDINGS:  Regional soft tissues unremarkable.  No aggressive osseus lesion.  Vertebral body heights are intact.  Small multilevel anterior osteophytes with minimal associated disc height  loss.  Moderate facet hypertrophy on the right at L4-5.   Impression  1.  Vertebral body heights are intact without evidence of acute fracture. 2.  Mild multilevel degenerative disc disease in the lumbar spine. 3.  Moderate facet hypertrophy on the right at L4-5.  Travis CHRISTOPHER CLINE, MD    I have personally reviewed the images and agree with the above interpretation.  Assessment and Plan: [name] is s/p lumbar decompression L3-L5 by Dr.  Clois on 06/19/22.   He has intermittent LBP with intermittent bilateral leg pain (entire leg) to his ankles x months. LBP = leg pain, right leg pain > left leg pain. He has intermittent numbness and tingling in his legs with weakness. Pain is worse with standing and walking.   He has known lumbar spondylosis and DDD.   Treatment options discussed with patient and following plan made:   - MRI of lumbar spine to further evaluate LBP and bilateral leg pain.  - He can continue on prn ultram from PCP.  - He can continue on lyrica from neurology.  - Will schedule phone visit to review MRI results once I get them back.   I spent a total of 25 minutes in face-to-face and non-face-to-face activities related to this patient's care today  including review of outside records, review of imaging, review of symptoms, physical exam, discussion of differential diagnosis, discussion of treatment options, and documentation.   Thank you for involving me in the care of this patient.   Glade Boys PA-C Dept. of Neurosurgery

## 2024-02-12 ENCOUNTER — Encounter: Payer: Self-pay | Admitting: Orthopedic Surgery

## 2024-02-12 ENCOUNTER — Ambulatory Visit (INDEPENDENT_AMBULATORY_CARE_PROVIDER_SITE_OTHER): Admitting: Orthopedic Surgery

## 2024-02-12 VITALS — BP 108/76 | Ht 66.0 in | Wt 194.5 lb

## 2024-02-12 DIAGNOSIS — M4726 Other spondylosis with radiculopathy, lumbar region: Secondary | ICD-10-CM

## 2024-02-12 DIAGNOSIS — Z9889 Other specified postprocedural states: Secondary | ICD-10-CM | POA: Diagnosis not present

## 2024-02-12 DIAGNOSIS — M51362 Other intervertebral disc degeneration, lumbar region with discogenic back pain and lower extremity pain: Secondary | ICD-10-CM

## 2024-02-12 DIAGNOSIS — M5416 Radiculopathy, lumbar region: Secondary | ICD-10-CM

## 2024-02-12 DIAGNOSIS — M47816 Spondylosis without myelopathy or radiculopathy, lumbar region: Secondary | ICD-10-CM

## 2024-02-12 NOTE — Patient Instructions (Signed)
 It was so nice to see you today. Thank you so much for coming in.    You have some wear and tear in your back (arthritis).    I want to get an MRI of your lower back  to look into things further. We will get this approved through your insurance and [facility] will call you to schedule the appointment. Ask about your patient responsibility. You do not need to pay this prior to getting MRI, they can bill you.   [facility] is located at [address] in [place]. This is near the intersection of [address] and [address].   After you have the MRI, it can take 14-28 days for me to get the results back. If I don't have them in 2 weeks, we will call to try to get the results.   Once I have the results, we will call you to schedule a follow up phone visit with me to review them.   Please do not hesitate to call if you have any questions or concerns. You can also message me in MyChart.   Glade Boys PA-C [phone]     The physicians and staff at [facility] Neurosurgery at [facility] are committed to providing excellent care. You may receive a survey asking for feedback about your experience at our office. We value you your feedback and appreciate you taking the time to to fill it out. The [facility] leadership team is also available to discuss your experience in person, feel free to contact us  [phone].

## 2024-03-01 ENCOUNTER — Ambulatory Visit: Admitting: Orthopedic Surgery

## 2024-03-04 ENCOUNTER — Inpatient Hospital Stay: Admission: RE | Admit: 2024-03-04 | Source: Ambulatory Visit
